# Patient Record
Sex: Female | Born: 1970 | Race: White | Hispanic: No | Smoking: Current every day smoker
Health system: Southern US, Community
[De-identification: ages and names within clinical notes are randomized; demographics above are authoritative.]

## PROBLEM LIST (undated history)

## (undated) DIAGNOSIS — D649 Anemia, unspecified: Secondary | ICD-10-CM

## (undated) DIAGNOSIS — K635 Polyp of colon: Secondary | ICD-10-CM

## (undated) DIAGNOSIS — N858 Other specified noninflammatory disorders of uterus: Secondary | ICD-10-CM

## (undated) DIAGNOSIS — R51 Headache: Secondary | ICD-10-CM

## (undated) DIAGNOSIS — K8020 Calculus of gallbladder without cholecystitis without obstruction: Secondary | ICD-10-CM

## (undated) DIAGNOSIS — R569 Unspecified convulsions: Secondary | ICD-10-CM

## (undated) DIAGNOSIS — K589 Irritable bowel syndrome without diarrhea: Secondary | ICD-10-CM

## (undated) DIAGNOSIS — I82409 Acute embolism and thrombosis of unspecified deep veins of unspecified lower extremity: Secondary | ICD-10-CM

## (undated) DIAGNOSIS — I10 Essential (primary) hypertension: Secondary | ICD-10-CM

## (undated) DIAGNOSIS — J189 Pneumonia, unspecified organism: Secondary | ICD-10-CM

## (undated) DIAGNOSIS — K219 Gastro-esophageal reflux disease without esophagitis: Secondary | ICD-10-CM

## (undated) DIAGNOSIS — G8929 Other chronic pain: Secondary | ICD-10-CM

## (undated) HISTORY — DX: Irritable bowel syndrome, unspecified: K58.9

## (undated) HISTORY — DX: Acute embolism and thrombosis of unspecified deep veins of unspecified lower extremity: I82.409

## (undated) HISTORY — DX: Headache: R51

## (undated) HISTORY — DX: Polyp of colon: K63.5

## (undated) HISTORY — DX: Calculus of gallbladder without cholecystitis without obstruction: K80.20

## (undated) HISTORY — DX: Essential (primary) hypertension: I10

## (undated) HISTORY — DX: Unspecified convulsions: R56.9

## (undated) HISTORY — DX: Gastro-esophageal reflux disease without esophagitis: K21.9

## (undated) HISTORY — DX: Other chronic pain: G89.29

## (undated) HISTORY — DX: Pneumonia, unspecified organism: J18.9

## (undated) HISTORY — PX: DILATION AND CURETTAGE OF UTERUS: SHX78

## (undated) HISTORY — DX: Anemia, unspecified: D64.9

## (undated) HISTORY — DX: Other specified noninflammatory disorders of uterus: N85.8

---

## 2001-11-19 ENCOUNTER — Encounter: Payer: Self-pay | Admitting: Family Medicine

## 2001-11-19 ENCOUNTER — Ambulatory Visit (HOSPITAL_COMMUNITY): Admission: RE | Admit: 2001-11-19 | Discharge: 2001-11-19 | Payer: Self-pay | Admitting: Family Medicine

## 2002-01-14 ENCOUNTER — Other Ambulatory Visit: Admission: RE | Admit: 2002-01-14 | Discharge: 2002-01-14 | Payer: Self-pay | Admitting: Family Medicine

## 2002-01-22 ENCOUNTER — Ambulatory Visit (HOSPITAL_COMMUNITY): Admission: RE | Admit: 2002-01-22 | Discharge: 2002-01-22 | Payer: Self-pay | Admitting: Family Medicine

## 2002-01-22 ENCOUNTER — Encounter: Payer: Self-pay | Admitting: Family Medicine

## 2002-05-05 ENCOUNTER — Encounter: Payer: Self-pay | Admitting: Family Medicine

## 2002-05-05 ENCOUNTER — Ambulatory Visit (HOSPITAL_COMMUNITY): Admission: RE | Admit: 2002-05-05 | Discharge: 2002-05-05 | Payer: Self-pay | Admitting: Family Medicine

## 2002-05-08 ENCOUNTER — Inpatient Hospital Stay (HOSPITAL_COMMUNITY): Admission: AD | Admit: 2002-05-08 | Discharge: 2002-05-08 | Payer: Self-pay | Admitting: Family Medicine

## 2002-05-14 ENCOUNTER — Inpatient Hospital Stay (HOSPITAL_COMMUNITY): Admission: AD | Admit: 2002-05-14 | Discharge: 2002-05-14 | Payer: Self-pay | Admitting: Family Medicine

## 2002-06-01 ENCOUNTER — Inpatient Hospital Stay (HOSPITAL_COMMUNITY): Admission: AD | Admit: 2002-06-01 | Discharge: 2002-06-01 | Payer: Self-pay | Admitting: Family Medicine

## 2002-06-05 ENCOUNTER — Inpatient Hospital Stay (HOSPITAL_COMMUNITY): Admission: AD | Admit: 2002-06-05 | Discharge: 2002-06-10 | Payer: Self-pay | Admitting: Family Medicine

## 2004-03-21 ENCOUNTER — Ambulatory Visit: Payer: Self-pay | Admitting: Family Medicine

## 2004-08-10 ENCOUNTER — Ambulatory Visit: Payer: Self-pay | Admitting: Family Medicine

## 2004-10-13 ENCOUNTER — Ambulatory Visit: Payer: Self-pay | Admitting: Family Medicine

## 2004-12-07 ENCOUNTER — Ambulatory Visit: Payer: Self-pay | Admitting: Family Medicine

## 2005-04-02 ENCOUNTER — Ambulatory Visit: Payer: Self-pay | Admitting: Family Medicine

## 2005-04-27 ENCOUNTER — Ambulatory Visit: Payer: Self-pay | Admitting: Family Medicine

## 2005-06-15 ENCOUNTER — Ambulatory Visit: Payer: Self-pay | Admitting: Family Medicine

## 2005-06-19 ENCOUNTER — Ambulatory Visit: Payer: Self-pay | Admitting: Family Medicine

## 2005-07-26 ENCOUNTER — Ambulatory Visit: Payer: Self-pay | Admitting: Family Medicine

## 2006-06-11 ENCOUNTER — Ambulatory Visit: Payer: Self-pay | Admitting: Family Medicine

## 2006-07-31 ENCOUNTER — Ambulatory Visit: Payer: Self-pay | Admitting: Family Medicine

## 2006-12-05 ENCOUNTER — Encounter: Admission: RE | Admit: 2006-12-05 | Discharge: 2006-12-05 | Payer: Self-pay | Admitting: Gastroenterology

## 2006-12-06 ENCOUNTER — Encounter: Admission: RE | Admit: 2006-12-06 | Discharge: 2006-12-06 | Payer: Self-pay | Admitting: Gastroenterology

## 2006-12-19 ENCOUNTER — Encounter: Admission: RE | Admit: 2006-12-19 | Discharge: 2006-12-19 | Payer: Self-pay | Admitting: Gastroenterology

## 2007-05-28 ENCOUNTER — Emergency Department (HOSPITAL_COMMUNITY): Admission: EM | Admit: 2007-05-28 | Discharge: 2007-05-29 | Payer: Self-pay | Admitting: Emergency Medicine

## 2007-06-11 ENCOUNTER — Ambulatory Visit: Payer: Self-pay | Admitting: Gastroenterology

## 2010-09-19 NOTE — Assessment & Plan Note (Signed)
[facility]                         GASTROENTEROLOGY OFFICE NOTE   KENEDIE, DIROCCO                      MRN:          [id number]  DATE:06/11/2007                            DOB:          Jun 09, 1970    REFERRING PHYSICIAN:  SCOTT LONG, PA-C   REASON FOR CONSULTATION:  Chronic abdominal pain.   HISTORY OF PRESENT ILLNESS:  Mrs. Degeorge is a 40 year old white female,  who relates a long history of generalized abdominal pain, described as  stabbing and crampy, associated with abdominal bloating and alternating  diarrhea and constipation.  She was evaluated by Dr. Charlott Rakes in  the fall of 2008 and underwent an upper endoscopy on January 29, 2007,  that showed a small hiatal hernia. A duodenal biopsy showed normal  duodenal mucosa.  She underwent colonoscopy on January 29, 2007, which  showed a 3 mm polyp at the rectosigmoid junction with biopsies showing  adenomatous tissue.  She has had worsening problems with tightness in  her throat, heartburn, indigestion, intermittent nausea and vomiting,  chest pain, generalized abdominal pain, bloating and a change in  appetite.  Apparently she could not get a rapid appointment with Dr.  Charlott Rakes for followup, so she scheduled an appointment with our  office.  She was seen in the emergency room on May 28, 2007.  A  urinalysis, CBC, chemistry panel and lipase were all performed, showing  a hemoglobin of 9.1 with an MCV of 71.3.  She has had longstanding  problems with menorrhagia and has had a recent ovarian cyst diagnosed.  She has followup with her gynecologist, Dr. Mora Appl, in [place], in two  weeks for these problems.   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  A chest CT  was  performed in August of 2008, which showed small subpleural nodules with  recommendations for followup CT scan.  Transabdominal and transvaginal  ultrasound on December 06, 2006, showed a 5.1 cm septated left ovarian cyst  and a uterine fibroid, measuring 2 cm.   PAST MEDICAL HISTORY:  Hypertension  adenomatous colon polyp  status post C-sections - 2004 and 2007  status post ovarian cystectomy - 2002   CURRENT MEDICATIONS:  1. Amoxicillin 500 mg t.i.d.  2. Multivitamin daily.  3. Prilosec OTC daily p.r.n.   MEDICATION ALLERGIES:  HYDROCODONE, leading to itching and hives.   SOCIAL HISTORY:  Per the handwritten form.   REVIEW OF SYSTEMS:  Per the handwritten form.   PHYSICAL EXAM:  Obese white female with a flat affect, who became  tearful during the interview when describing her problems.  Blood pressure 124/80, pulse 92 and regular.  HEENT EXAM:  Anicteric sclerae.  Oropharynx clear.  CHEST:  Clear to auscultation bilaterally.  CARDIAC:  Regular rate and rhythm without murmurs appreciated.  ABDOMEN:  Large, soft and nondistended with normoactive bowel sounds.  She has mild diffuse tenderness  to deep palpation without rebound or  guarding.  No palpable organomegaly, masses or hernias.  Normoactive  bowel sounds.  EXTREMITIES:  Without clubbing, cyanosis or edema.  NEUROLOGIC:  Alert and oriented times three, flat affect and tearful.  Otherwise, grossly nonfocal.   ASSESSMENT AND PLAN:  Generalized abdominal pain, associated with  abdominal bloating, variable bowel habits, a microcytic anemia-presumed  iron deficiency, GERD and globus sensation.  I am concerned she may have  underlying depression or anxiety.  She is advised to follow up with  Lindaann Pascal, P.A.-C., and she is advised to choose one  gastroenterologist, either me or Dr. Bosie Clos for long-term management.  Begin standard antireflux measures for GERD.  Begin Xifaxan 400 mg  t.i.d. for ten days, Robinul Forte  b.i.d., discontinue Prilosec and  begin Aciphex 20 mg p.o. q.a.m.  She is given information on a low-gas  diet with antireflux measures.  We will obtain standard blood work for  evaluation of her anemia and stool Hemoccults. Her anemia is likely  seconary to mentrual losses. She may need a small-bowel series or  capsule endoscopy to complete her evaluation.     Venita Lick. Russella Dar, MD, [facility]  Electronically Signed    MTS/MedQ  DD: 06/11/2007  DT: 06/11/2007  Job #: [id number]   cc:   Lindaann Pascal, PA

## 2011-01-26 LAB — DIFFERENTIAL
Eosinophils Absolute: 0.3
Eosinophils Relative: 3
Lymphs Abs: 2.7
Monocytes Relative: 6

## 2011-01-26 LAB — CBC
MCHC: 32
RBC: 4
WBC: 8.4

## 2011-01-26 LAB — URINALYSIS, ROUTINE W REFLEX MICROSCOPIC
Glucose, UA: NEGATIVE
Protein, ur: NEGATIVE
Specific Gravity, Urine: 1.021
pH: 7

## 2011-01-26 LAB — POCT PREGNANCY, URINE
Operator id: 29727
Preg Test, Ur: NEGATIVE

## 2011-01-26 LAB — COMPREHENSIVE METABOLIC PANEL
ALT: 19
AST: 18
Alkaline Phosphatase: 94
CO2: 22
Calcium: 8.9
GFR calc Af Amer: >60
GFR calc non Af Amer: >60
Potassium: 3.9
Sodium: 135

## 2011-01-26 LAB — LIPASE, BLOOD: Lipase: 26

## 2012-01-04 ENCOUNTER — Other Ambulatory Visit (HOSPITAL_COMMUNITY): Payer: Self-pay | Admitting: Adult Health Nurse Practitioner

## 2012-01-04 DIAGNOSIS — R109 Unspecified abdominal pain: Secondary | ICD-10-CM

## 2012-01-09 ENCOUNTER — Other Ambulatory Visit (HOSPITAL_COMMUNITY): Payer: Self-pay

## 2012-06-05 ENCOUNTER — Encounter (INDEPENDENT_AMBULATORY_CARE_PROVIDER_SITE_OTHER): Payer: Self-pay

## 2012-06-05 ENCOUNTER — Encounter (INDEPENDENT_AMBULATORY_CARE_PROVIDER_SITE_OTHER): Payer: Self-pay | Admitting: Surgery

## 2012-06-11 ENCOUNTER — Encounter (HOSPITAL_BASED_OUTPATIENT_CLINIC_OR_DEPARTMENT_OTHER): Payer: Self-pay | Admitting: *Deleted

## 2012-06-11 ENCOUNTER — Encounter (INDEPENDENT_AMBULATORY_CARE_PROVIDER_SITE_OTHER): Payer: Self-pay | Admitting: Surgery

## 2012-06-11 ENCOUNTER — Ambulatory Visit (INDEPENDENT_AMBULATORY_CARE_PROVIDER_SITE_OTHER): Payer: 59 | Admitting: Surgery

## 2012-06-11 VITALS — BP 138/90 | HR 86 | Temp 98.4°F | Ht 63.0 in | Wt 209.8 lb

## 2012-06-11 DIAGNOSIS — K8010 Calculus of gallbladder with chronic cholecystitis without obstruction: Secondary | ICD-10-CM | POA: Insufficient documentation

## 2012-06-11 NOTE — Progress Notes (Signed)
Patient ID: Alexis Miranda, female   DOB: 02/03/1971, 41 y.o.   MRN: [id number]  Chief Complaint  Patient presents with  . New Evaluation    eval gallbladder    HPI Alexis Miranda is a 41 y.o. female.  Referred by Dr. Leonard Nyland for evaluation of gallbladder disease HPI This is a 41-year-old female in good health who presents with 2 month history of intermittent right upper quadrant abdominal pain, nausea, vomiting, diarrhea. She states over the last several days this pain has become more constant. It gets worse when she tries to eat. She has also had a lot of abdominal bloating. She underwent a workup including an ultrasound showing inflammatory changes in the wall for gallbladder with several small echogenic foci in the gallbladder wall. There are no mobile gallstones noted. Her liver function tests from 2 weeks ago are normal. She is now referred for surgical evaluation.  Past Medical History  Diagnosis Date  . Anemia   . GERD (gastroesophageal reflux disease)   . Hypertension     Past Surgical History  Procedure Date  . Abdominal hysterectomy   . Cystectomy   . Cesarean section 06/07/02 03/07/06    2x    Family History  Problem Relation Age of Onset  . Heart attack Mother   . Cancer Father     colon    Social History History  Substance Use Topics  . Smoking status: Former Smoker  . Smokeless tobacco: Former User    Quit date: 06/11/2005  . Alcohol Use: Yes     Comment: prn    Allergies  Allergen Reactions  . Hydrocodone     Current Outpatient Prescriptions  Medication Sig Dispense Refill  . Vitamin D, Ergocalciferol, (DRISDOL) 50000 UNITS CAPS Take 50,000 Units by mouth every 7 (seven) days.        Review of Systems Review of Systems  Constitutional: Negative for fever, chills and unexpected weight change.  HENT: Negative for hearing loss, congestion, sore throat, trouble swallowing and voice change.   Eyes: Negative for visual disturbance.   Respiratory: Negative for cough and wheezing.   Cardiovascular: Negative for chest pain, palpitations and leg swelling.  Gastrointestinal: Positive for nausea, vomiting, abdominal pain, diarrhea and abdominal distention. Negative for constipation, blood in stool and anal bleeding.  Genitourinary: Negative for hematuria, vaginal bleeding and difficulty urinating.  Musculoskeletal: Negative for arthralgias.  Skin: Negative for rash and wound.  Neurological: Negative for seizures, syncope and headaches.  Hematological: Negative for adenopathy. Does not bruise/bleed easily.  Psychiatric/Behavioral: Negative for confusion.    Blood pressure 138/90, pulse 86, temperature 98.4 F (36.9 C), temperature source Temporal, height 5' 3" (1.6 m), weight 209 lb 12.8 oz (95.165 kg), SpO2 98.00%.  Physical Exam Physical Exam WDWN in NAD HEENT:  EOMI, sclera anicteric Neck:  No masses, no thyromegaly Lungs:  CTA bilaterally; normal respiratory effort CV:  Regular rate and rhythm; no murmurs Abd:  +bowel sounds,tender in RUQ with radiation around to her back Ext:  Well-perfused; no edema Skin:  Warm, dry; no sign of jaundice  Data Reviewed Ultrasound/ labs as described above  Assessment    Chronic, possibly acute cholecystitis    Plan    Laparoscopic cholecystectomy with intraoperative cholangiogram.  The surgical procedure has been discussed with the patient.  Potential risks, benefits, alternative treatments, and expected outcomes have been explained.  All of the patient's questions at this time have been answered.  The likelihood of reaching the patient's treatment   goal is good.  The patient understand the proposed surgical procedure and wishes to proceed.        Wise Fees K. 06/11/2012, 2:39 PM    

## 2012-06-11 NOTE — Progress Notes (Signed)
No labs needed

## 2012-06-12 ENCOUNTER — Ambulatory Visit (HOSPITAL_BASED_OUTPATIENT_CLINIC_OR_DEPARTMENT_OTHER): Payer: 59 | Admitting: Certified Registered Nurse Anesthetist

## 2012-06-12 ENCOUNTER — Encounter (HOSPITAL_BASED_OUTPATIENT_CLINIC_OR_DEPARTMENT_OTHER)
Admission: RE | Disposition: A | Discharge status: Home / Self Care | Payer: Self-pay | Source: Ambulatory Visit | Attending: Surgery

## 2012-06-12 ENCOUNTER — Encounter (HOSPITAL_BASED_OUTPATIENT_CLINIC_OR_DEPARTMENT_OTHER): Payer: Self-pay | Admitting: *Deleted

## 2012-06-12 ENCOUNTER — Encounter (HOSPITAL_BASED_OUTPATIENT_CLINIC_OR_DEPARTMENT_OTHER): Payer: Self-pay | Admitting: Certified Registered Nurse Anesthetist

## 2012-06-12 ENCOUNTER — Ambulatory Visit (HOSPITAL_BASED_OUTPATIENT_CLINIC_OR_DEPARTMENT_OTHER)
Admission: RE | Admit: 2012-06-12 | Discharge: 2012-06-12 | Disposition: A | Discharge status: Home / Self Care | Payer: 59 | Source: Ambulatory Visit | Attending: Surgery | Admitting: Surgery

## 2012-06-12 ENCOUNTER — Ambulatory Visit (HOSPITAL_COMMUNITY): Payer: 59

## 2012-06-12 DIAGNOSIS — K8010 Calculus of gallbladder with chronic cholecystitis without obstruction: Secondary | ICD-10-CM | POA: Insufficient documentation

## 2012-06-12 DIAGNOSIS — I10 Essential (primary) hypertension: Secondary | ICD-10-CM | POA: Insufficient documentation

## 2012-06-12 DIAGNOSIS — K811 Chronic cholecystitis: Secondary | ICD-10-CM

## 2012-06-12 HISTORY — PX: CHOLECYSTECTOMY: SHX55

## 2012-06-12 LAB — POCT HEMOGLOBIN-HEMACUE: Hemoglobin: 12.6 g/dL (ref 12.0–15.0)

## 2012-06-12 SURGERY — LAPAROSCOPIC CHOLECYSTECTOMY WITH INTRAOPERATIVE CHOLANGIOGRAM
Anesthesia: General | Site: Abdomen | Wound class: Clean

## 2012-06-12 MED ORDER — HYDROMORPHONE HCL 2 MG PO TABS: 2.0000 mg | ORAL_TABLET | ORAL | Status: DC | PRN

## 2012-06-12 MED ORDER — BUPIVACAINE-EPINEPHRINE 0.25% -1:200000 IJ SOLN
INTRAMUSCULAR | Status: DC | PRN
  Administered 2012-06-12: 13 mL

## 2012-06-12 MED ORDER — SODIUM CHLORIDE 0.9 % IR SOLN
Status: DC | PRN
  Administered 2012-06-12: 1000 mL

## 2012-06-12 MED ORDER — PROPOFOL 10 MG/ML IV BOLUS
INTRAVENOUS | Status: DC | PRN
  Administered 2012-06-12: 200 mg via INTRAVENOUS

## 2012-06-12 MED ORDER — MIDAZOLAM HCL 2 MG/2ML IJ SOLN: 1.0000 mg | INTRAMUSCULAR | Status: DC | PRN

## 2012-06-12 MED ORDER — MIDAZOLAM HCL 5 MG/5ML IJ SOLN
INTRAMUSCULAR | Status: DC | PRN
  Administered 2012-06-12: 1 mg via INTRAVENOUS

## 2012-06-12 MED ORDER — HYDROMORPHONE HCL PF 1 MG/ML IJ SOLN
0.2500 mg | INTRAMUSCULAR | Status: DC | PRN
  Administered 2012-06-12 (×4): 0.5 mg via INTRAVENOUS

## 2012-06-12 MED ORDER — CEFAZOLIN SODIUM-DEXTROSE 2-3 GM-% IV SOLR
2.0000 g | INTRAVENOUS | Status: AC
  Administered 2012-06-12: 2 g via INTRAVENOUS

## 2012-06-12 MED ORDER — LABETALOL HCL 5 MG/ML IV SOLN
5.0000 mg | INTRAVENOUS | Status: DC | PRN
  Administered 2012-06-12 (×2): 10 mg via INTRAVENOUS

## 2012-06-12 MED ORDER — KETOROLAC TROMETHAMINE 30 MG/ML IJ SOLN
INTRAMUSCULAR | Status: DC | PRN
  Administered 2012-06-12: 30 mg via INTRAVENOUS

## 2012-06-12 MED ORDER — ONDANSETRON HCL 4 MG/2ML IJ SOLN
INTRAMUSCULAR | Status: DC | PRN
  Administered 2012-06-12: 4 mg via INTRAVENOUS

## 2012-06-12 MED ORDER — NEOSTIGMINE METHYLSULFATE 1 MG/ML IJ SOLN
INTRAMUSCULAR | Status: DC | PRN
  Administered 2012-06-12: 3.5 mg via INTRAVENOUS

## 2012-06-12 MED ORDER — PROMETHAZINE HCL 25 MG/ML IJ SOLN: 6.2500 mg | INTRAMUSCULAR | Status: DC | PRN

## 2012-06-12 MED ORDER — MORPHINE SULFATE 2 MG/ML IJ SOLN: 2.0000 mg | INTRAMUSCULAR | Status: DC | PRN

## 2012-06-12 MED ORDER — IOHEXOL 300 MG/ML  SOLN
INTRAMUSCULAR | Status: DC | PRN
  Administered 2012-06-12: 13:00:00

## 2012-06-12 MED ORDER — CHLORHEXIDINE GLUCONATE 4 % EX LIQD: 1.0000 "application " | Freq: Once | CUTANEOUS | Status: DC

## 2012-06-12 MED ORDER — FENTANYL CITRATE 0.05 MG/ML IJ SOLN: 50.0000 ug | INTRAMUSCULAR | Status: DC | PRN

## 2012-06-12 MED ORDER — ONDANSETRON HCL 4 MG/2ML IJ SOLN: 4.0000 mg | INTRAMUSCULAR | Status: DC | PRN

## 2012-06-12 MED ORDER — ROCURONIUM BROMIDE 100 MG/10ML IV SOLN
INTRAVENOUS | Status: DC | PRN
  Administered 2012-06-12: 40 mg via INTRAVENOUS

## 2012-06-12 MED ORDER — LACTATED RINGERS IV SOLN
INTRAVENOUS | Status: DC
  Administered 2012-06-12 (×3): via INTRAVENOUS

## 2012-06-12 MED ORDER — DEXAMETHASONE SODIUM PHOSPHATE 4 MG/ML IJ SOLN
INTRAMUSCULAR | Status: DC | PRN
  Administered 2012-06-12: 10 mg via INTRAVENOUS

## 2012-06-12 MED ORDER — PROMETHAZINE HCL 25 MG/ML IJ SOLN
6.2500 mg | INTRAMUSCULAR | Status: AC | PRN
  Administered 2012-06-12 (×2): 6.25 mg via INTRAVENOUS

## 2012-06-12 MED ORDER — FENTANYL CITRATE 0.05 MG/ML IJ SOLN: 50.0000 ug | Freq: Once | INTRAMUSCULAR | Status: DC

## 2012-06-12 MED ORDER — LIDOCAINE HCL (CARDIAC) 20 MG/ML IV SOLN
INTRAVENOUS | Status: DC | PRN
  Administered 2012-06-12: 60 mg via INTRAVENOUS

## 2012-06-12 MED ORDER — HYDROMORPHONE HCL 2 MG PO TABS
2.0000 mg | ORAL_TABLET | Freq: Once | ORAL | Status: AC | PRN
  Administered 2012-06-12: 2 mg via ORAL

## 2012-06-12 MED ORDER — GLYCOPYRROLATE 0.2 MG/ML IJ SOLN
INTRAMUSCULAR | Status: DC | PRN
  Administered 2012-06-12: .5 mg via INTRAVENOUS

## 2012-06-12 MED ORDER — FENTANYL CITRATE 0.05 MG/ML IJ SOLN
INTRAMUSCULAR | Status: DC | PRN
  Administered 2012-06-12 (×2): 25 ug via INTRAVENOUS
  Administered 2012-06-12: 50 ug via INTRAVENOUS

## 2012-06-12 MED ORDER — PROMETHAZINE HCL 12.5 MG PO TABS: 12.5000 mg | ORAL_TABLET | Freq: Four times a day (QID) | ORAL | Status: DC | PRN

## 2012-06-12 SURGICAL SUPPLY — 46 items
APPLIER CLIP ROT 10 11.4 M/L (STAPLE) ×2
BENZOIN TINCTURE PRP APPL 2/3 (GAUZE/BANDAGES/DRESSINGS) ×2 IMPLANT
BLADE SURG ROTATE 9660 (MISCELLANEOUS) IMPLANT
CANISTER SUCTION 2500CC (MISCELLANEOUS) ×2 IMPLANT
CHLORAPREP W/TINT 26ML (MISCELLANEOUS) ×2 IMPLANT
CLIP APPLIE ROT 10 11.4 M/L (STAPLE) ×1 IMPLANT
CLOTH BEACON ORANGE TIMEOUT ST (SAFETY) ×2 IMPLANT
COVER MAYO STAND STRL (DRAPES) ×2 IMPLANT
COVER SURGICAL LIGHT HANDLE (MISCELLANEOUS) ×2 IMPLANT
DECANTER SPIKE VIAL GLASS SM (MISCELLANEOUS) ×4 IMPLANT
DRAPE C-ARM 42X72 X-RAY (DRAPES) ×2 IMPLANT
DRAPE UTILITY 15X26 W/TAPE STR (DRAPE) ×4 IMPLANT
DRSG TEGADERM 2-3/8X2-3/4 SM (GAUZE/BANDAGES/DRESSINGS) ×6 IMPLANT
DRSG TEGADERM 4X4.75 (GAUZE/BANDAGES/DRESSINGS) ×2 IMPLANT
ELECT REM PT RETURN 9FT ADLT (ELECTROSURGICAL) ×2
ELECTRODE REM PT RTRN 9FT ADLT (ELECTROSURGICAL) ×1 IMPLANT
FILTER SMOKE EVAC LAPAROSHD (FILTER) ×4 IMPLANT
GLOVE BIO SURGEON STRL SZ 6.5 (GLOVE) ×2 IMPLANT
GLOVE BIO SURGEON STRL SZ7 (GLOVE) ×2 IMPLANT
GLOVE BIOGEL PI IND STRL 6.5 (GLOVE) ×1 IMPLANT
GLOVE BIOGEL PI IND STRL 7.5 (GLOVE) ×1 IMPLANT
GLOVE BIOGEL PI INDICATOR 6.5 (GLOVE) ×1
GLOVE BIOGEL PI INDICATOR 7.5 (GLOVE) ×1
GLOVE ECLIPSE 6.5 STRL STRAW (GLOVE) ×2 IMPLANT
GLOVE INDICATOR 7.0 STRL GRN (GLOVE) ×2 IMPLANT
GLOVE SKINSENSE NS SZ7.0 (GLOVE) ×1
GLOVE SKINSENSE STRL SZ7.0 (GLOVE) ×1 IMPLANT
GOWN STRL NON-REIN LRG LVL3 (GOWN DISPOSABLE) ×8 IMPLANT
HEMOSTAT SNOW SURGICEL 2X4 (HEMOSTASIS) ×2 IMPLANT
NS IRRIG 1000ML POUR BTL (IV SOLUTION) IMPLANT
PACK BASIN DAY SURGERY FS (CUSTOM PROCEDURE TRAY) ×2 IMPLANT
PAD ARMBOARD 7.5X6 YLW CONV (MISCELLANEOUS) ×2 IMPLANT
POUCH SPECIMEN RETRIEVAL 10MM (ENDOMECHANICALS) ×2 IMPLANT
SCISSORS LAP 5X35 DISP (ENDOMECHANICALS) IMPLANT
SET CHOLANGIOGRAPH 5 50 .035 (SET/KITS/TRAYS/PACK) ×2 IMPLANT
SET IRRIG TUBING LAPAROSCOPIC (IRRIGATION / IRRIGATOR) ×2 IMPLANT
SLEEVE ENDOPATH XCEL 5M (ENDOMECHANICALS) ×2 IMPLANT
SPECIMEN JAR SMALL (MISCELLANEOUS) ×2 IMPLANT
SPONGE GAUZE 2X2 8PLY STRL LF (GAUZE/BANDAGES/DRESSINGS) ×2 IMPLANT
SUT MNCRL AB 4-0 PS2 18 (SUTURE) ×2 IMPLANT
SUT VICRYL 0 UR6 27IN ABS (SUTURE) IMPLANT
TOWEL OR 17X24 6PK STRL BLUE (TOWEL DISPOSABLE) ×2 IMPLANT
TRAY LAPAROSCOPIC (CUSTOM PROCEDURE TRAY) ×2 IMPLANT
TROCAR XCEL BLUNT TIP 100MML (ENDOMECHANICALS) ×2 IMPLANT
TROCAR XCEL NON-BLD 11X100MML (ENDOMECHANICALS) ×2 IMPLANT
TROCAR XCEL NON-BLD 5MMX100MML (ENDOMECHANICALS) ×2 IMPLANT

## 2012-06-12 NOTE — H&P (View-Only) (Signed)
Patient ID: Alexis Miranda, female   DOB: 1970-10-02, 42 y.o.   MRN: [id number]  Chief Complaint  Patient presents with  . New Evaluation    eval gallbladder    HPI Alexis Miranda is a 42 y.o. female.  Referred by Dr. Joette Catching for evaluation of gallbladder disease HPI This is a 42 year old female in good health who presents with 2 month history of intermittent right upper quadrant abdominal pain, nausea, vomiting, diarrhea. She states over the last several days this pain has become more constant. It gets worse when she tries to eat. She has also had a lot of abdominal bloating. She underwent a workup including an ultrasound showing inflammatory changes in the wall for gallbladder with several small echogenic foci in the gallbladder wall. There are no mobile gallstones noted. Her liver function tests from 2 weeks ago are normal. She is now referred for surgical evaluation.  Past Medical History  Diagnosis Date  . Anemia   . GERD (gastroesophageal reflux disease)   . Hypertension     Past Surgical History  Procedure Date  . Abdominal hysterectomy   . Cystectomy   . Cesarean section 06/07/02 03/07/06    2x    Family History  Problem Relation Age of Onset  . Heart attack Mother   . Cancer Father     colon    Social History History  Substance Use Topics  . Smoking status: Former Games developer  . Smokeless tobacco: Former Neurosurgeon    Quit date: 06/11/2005  . Alcohol Use: Yes     Comment: prn    Allergies  Allergen Reactions  . Hydrocodone     Current Outpatient Prescriptions  Medication Sig Dispense Refill  . Vitamin D, Ergocalciferol, (DRISDOL) 50000 UNITS CAPS Take 50,000 Units by mouth every 7 (seven) days.        Review of Systems Review of Systems  Constitutional: Negative for fever, chills and unexpected weight change.  HENT: Negative for hearing loss, congestion, sore throat, trouble swallowing and voice change.   Eyes: Negative for visual disturbance.   Respiratory: Negative for cough and wheezing.   Cardiovascular: Negative for chest pain, palpitations and leg swelling.  Gastrointestinal: Positive for nausea, vomiting, abdominal pain, diarrhea and abdominal distention. Negative for constipation, blood in stool and anal bleeding.  Genitourinary: Negative for hematuria, vaginal bleeding and difficulty urinating.  Musculoskeletal: Negative for arthralgias.  Skin: Negative for rash and wound.  Neurological: Negative for seizures, syncope and headaches.  Hematological: Negative for adenopathy. Does not bruise/bleed easily.  Psychiatric/Behavioral: Negative for confusion.    Blood pressure 138/90, pulse 86, temperature 98.4 F (36.9 C), temperature source Temporal, height 5\' 3"  (1.6 m), weight 209 lb 12.8 oz (95.165 kg), SpO2 98.00%.  Physical Exam Physical Exam WDWN in NAD HEENT:  EOMI, sclera anicteric Neck:  No masses, no thyromegaly Lungs:  CTA bilaterally; normal respiratory effort CV:  Regular rate and rhythm; no murmurs Abd:  +bowel sounds,tender in RUQ with radiation around to her back Ext:  Well-perfused; no edema Skin:  Warm, dry; no sign of jaundice  Data Reviewed Ultrasound/ labs as described above  Assessment    Chronic, possibly acute cholecystitis    Plan    Laparoscopic cholecystectomy with intraoperative cholangiogram.  The surgical procedure has been discussed with the patient.  Potential risks, benefits, alternative treatments, and expected outcomes have been explained.  All of the patient's questions at this time have been answered.  The likelihood of reaching the patient's treatment  goal is good.  The patient understand the proposed surgical procedure and wishes to proceed.        Sharde Gover K. 06/11/2012, 2:39 PM

## 2012-06-12 NOTE — Interval H&P Note (Signed)
History and Physical Interval Note:  06/12/2012 11:34 AM  Alexis Miranda  has presented today for surgery, with the diagnosis of chronic calculus cholecystitis  The various methods of treatment have been discussed with the patient and family. After consideration of risks, benefits and other options for treatment, the patient has consented to  Procedure(s) (LRB) with comments: LAPAROSCOPIC CHOLECYSTECTOMY WITH INTRAOPERATIVE CHOLANGIOGRAM (N/A) as a surgical intervention .  The patient's history has been reviewed, patient examined, no change in status, stable for surgery.  I have reviewed the patient's chart and labs.  Questions were answered to the patient's satisfaction.     [name]

## 2012-06-12 NOTE — Anesthesia Preprocedure Evaluation (Signed)
Anesthesia Evaluation  Patient identified by MRN, date of birth, ID band Patient awake    Reviewed: Allergy & Precautions, H&P , NPO status , Patient's Chart, lab work & pertinent test results  Airway Mallampati: II TM Distance: >3 FB Neck ROM: Full    Dental   Pulmonary former smoker,  breath sounds clear to auscultation        Cardiovascular hypertension, Rhythm:Regular Rate:Normal     Neuro/Psych    GI/Hepatic GERD-  ,  Endo/Other    Renal/GU      Musculoskeletal   Abdominal (+) + obese,   Peds  Hematology   Anesthesia Other Findings   Reproductive/Obstetrics                           Anesthesia Physical Anesthesia Plan  ASA: II  Anesthesia Plan: General   Post-op Pain Management:    Induction: Intravenous  Airway Management Planned: Oral ETT  Additional Equipment:   Intra-op Plan:   Post-operative Plan: Extubation in OR  Informed Consent: I have reviewed the patients History and Physical, chart, labs and discussed the procedure including the risks, benefits and alternatives for the proposed anesthesia with the patient or authorized representative who has indicated his/her understanding and acceptance.     Plan Discussed with: CRNA and Surgeon  Anesthesia Plan Comments:         Anesthesia Quick Evaluation

## 2012-06-12 NOTE — Anesthesia Postprocedure Evaluation (Signed)
  Anesthesia Post-op Note  Patient: Alexis Miranda  Procedure(s) Performed: Procedure(s) (LRB) with comments: LAPAROSCOPIC CHOLECYSTECTOMY WITH INTRAOPERATIVE CHOLANGIOGRAM (N/A)  Patient Location: PACU  Anesthesia Type:General  Level of Consciousness: awake  Airway and Oxygen Therapy: Patient Spontanous Breathing  Post-op Pain: mild  Post-op Assessment: Post-op Vital signs reviewed, Patient's Cardiovascular Status Stable, Respiratory Function Stable, Patent Airway, No signs of Nausea or vomiting, Adequate PO intake and Pain level controlled  Post-op Vital Signs: stable  Complications: No apparent anesthesia complications

## 2012-06-12 NOTE — Transfer of Care (Signed)
Immediate Anesthesia Transfer of Care Note  Patient: Alexis Miranda  Procedure(s) Performed: Procedure(s) (LRB) with comments: LAPAROSCOPIC CHOLECYSTECTOMY WITH INTRAOPERATIVE CHOLANGIOGRAM (N/A)  Patient Location: PACU  Anesthesia Type:General  Level of Consciousness: awake, alert , oriented and patient cooperative  Airway & Oxygen Therapy: Patient Spontanous Breathing and Patient connected to face mask oxygen  Post-op Assessment: Report given to PACU RN and Post -op Vital signs reviewed and stable  Post vital signs: Reviewed and stable  Complications: No apparent anesthesia complications

## 2012-06-12 NOTE — Op Note (Signed)
Laparoscopic Cholecystectomy with IOC Procedure Note  Indications: This patient presents with symptomatic gallbladder disease and will undergo laparoscopic cholecystectomy.  Pre-operative Diagnosis: Chronic cholecystitis  Post-operative Diagnosis: Same  Surgeon: Marissa Weaver K.   Assistants: none  Anesthesia: General endotracheal anesthesia  ASA Class: 2  Procedure Details  The patient was seen again in the Holding Room. The risks, benefits, complications, treatment options, and expected outcomes were discussed with the patient. The possibilities of reaction to medication, pulmonary aspiration, perforation of viscus, bleeding, recurrent infection, finding a normal gallbladder, the need for additional procedures, failure to diagnose a condition, the possible need to convert to an open procedure, and creating a complication requiring transfusion or operation were discussed with the patient. The likelihood of improving the patient's symptoms with return to their baseline status is good.  The patient and/or family concurred with the proposed plan, giving informed consent. The site of surgery properly noted. The patient was taken to Operating Room, identified as Alexis Miranda and the procedure verified as Laparoscopic Cholecystectomy with Intraoperative Cholangiogram. A Time Out was held and the above information confirmed.  Prior to the induction of general anesthesia, antibiotic prophylaxis was administered. General endotracheal anesthesia was then administered and tolerated well. After the induction, the abdomen was prepped with Chloraprep and draped in the sterile fashion. The patient was positioned in the supine position.  Local anesthetic agent was injected into the skin near the umbilicus and an incision made. We dissected down to the abdominal fascia with blunt dissection.  The fascia was incised vertically and we entered the peritoneal cavity bluntly.  A pursestring suture of 0-Vicryl was  placed around the fascial opening.  The Hasson cannula was inserted and secured with the stay suture.  Pneumoperitoneum was then created with CO2 and tolerated well without any adverse changes in the patient's vital signs. An 11-mm port was placed in the subxiphoid position.  Two 5-mm ports were placed in the right upper quadrant. All skin incisions were infiltrated with a local anesthetic agent before making the incision and placing the trocars.   We positioned the patient in reverse Trendelenburg, tilted slightly to the patient's left.  The gallbladder was identified, the fundus grasped and retracted cephalad. The omentum was adherent to the fundus of the gallbladder.  Adhesions were lysed bluntly and with the electrocautery where indicated, taking care not to injure any adjacent organs or viscus. The infundibulum was grasped and retracted laterally, exposing the peritoneum overlying the triangle of Calot. This was then divided and exposed in a blunt fashion. A critical view of the cystic duct and cystic artery was obtained.  The cystic duct was clearly identified and bluntly dissected circumferentially. The cystic duct was ligated with a clip distally.   An incision was made in the cystic duct and the [facility] cholangiogram catheter introduced. The catheter was secured using a clip. A cholangiogram was then obtained which showed good visualization of the distal and proximal biliary tree with no sign of filling defects or obstruction.  Contrast flowed easily into the duodenum. The catheter was then removed.   The cystic duct was then ligated with clips and divided. The cystic artery was identified, dissected free, ligated with clips and divided as well.   The gallbladder was dissected from the liver bed in retrograde fashion with the electrocautery. The gallbladder was removed and placed in an Endocatch sac. The liver bed was irrigated and inspected. Hemostasis was achieved with the electrocautery. Copious  irrigation was utilized and was repeatedly  aspirated until clear.  The gallbladder and Endocatch sac were then removed through the umbilical port site.  The pursestring suture was used to close the umbilical fascia.    We again inspected the right upper quadrant for hemostasis.  Pneumoperitoneum was released as we removed the trocars.  4-0 Monocryl was used to close the skin.   Benzoin, steri-strips, and clean dressings were applied. The patient was then extubated and brought to the recovery room in stable condition. Instrument, sponge, and needle counts were correct at closure and at the conclusion of the case.   Findings: Cholecystitis with Cholelithiasis  Estimated Blood Loss: Minimal         Drains: noen         Specimens: Gallbladder           Complications: None; patient tolerated the procedure well.         Disposition: PACU - hemodynamically stable.         Condition: stable  Wilmon Arms. Corliss Skains, MD, [facility]  06/12/2012 1:15 PM

## 2012-06-12 NOTE — Anesthesia Procedure Notes (Signed)
Procedure Name: Intubation Date/Time: 06/12/2012 12:04 PM Performed by: Unknown Schleyer D Pre-anesthesia Checklist: Patient identified, Emergency Drugs available, Suction available and Patient being monitored Patient Re-evaluated:Patient Re-evaluated prior to inductionOxygen Delivery Method: Circle System Utilized Preoxygenation: Pre-oxygenation with 100% oxygen Intubation Type: IV induction Ventilation: Mask ventilation without difficulty Laryngoscope Size: Mac and 3 Grade View: Grade I Tube type: Oral Tube size: 7.0 mm Number of attempts: 1 Airway Equipment and Method: stylet and LTA kit utilized Placement Confirmation: ETT inserted through vocal cords under direct vision,  positive ETCO2 and breath sounds checked- equal and bilateral Secured at: 21 cm Tube secured with: Tape Dental Injury: Teeth and Oropharynx as per pre-operative assessment

## 2012-06-13 ENCOUNTER — Encounter (HOSPITAL_BASED_OUTPATIENT_CLINIC_OR_DEPARTMENT_OTHER): Payer: Self-pay | Admitting: Surgery

## 2012-07-04 ENCOUNTER — Encounter (INDEPENDENT_AMBULATORY_CARE_PROVIDER_SITE_OTHER): Payer: 59 | Admitting: Surgery

## 2012-07-08 ENCOUNTER — Ambulatory Visit (INDEPENDENT_AMBULATORY_CARE_PROVIDER_SITE_OTHER): Payer: 59 | Admitting: Surgery

## 2012-07-08 ENCOUNTER — Encounter (INDEPENDENT_AMBULATORY_CARE_PROVIDER_SITE_OTHER): Payer: Self-pay | Admitting: Surgery

## 2012-07-08 ENCOUNTER — Encounter (INDEPENDENT_AMBULATORY_CARE_PROVIDER_SITE_OTHER): Payer: 59 | Admitting: Surgery

## 2012-07-08 VITALS — BP 136/72 | HR 68 | Temp 97.2°F | Resp 16 | Ht 63.0 in | Wt 208.4 lb

## 2012-07-08 NOTE — Progress Notes (Signed)
Status post laparoscopic cholecystectomy with intraoperative cholangiogram on 06/12/12. Pathology showed chronic calculus cholecystitis. She is doing quite well. Her bowel movements are not yet regulated. She alternates between  Diarrhea after eating greasy foods to occasional constipation. No abdominal pain other than the expected incisional tenderness.  Her incisions are well-healed with no sign of infection. Minimal tenderness to palpation.  She may resume full activity. Followup as needed.  Wilmon Arms. Corliss Skains, MD, [facility]  07/08/2012 11:30 AM

## 2012-12-31 DIAGNOSIS — R079 Chest pain, unspecified: Secondary | ICD-10-CM

## 2015-11-01 ENCOUNTER — Encounter: Payer: Self-pay | Admitting: Internal Medicine

## 2015-11-17 ENCOUNTER — Telehealth: Payer: Self-pay | Admitting: Gastroenterology

## 2015-11-17 NOTE — Telephone Encounter (Signed)
Left message for patient to call back  

## 2015-12-01 ENCOUNTER — Other Ambulatory Visit (INDEPENDENT_AMBULATORY_CARE_PROVIDER_SITE_OTHER): Payer: BLUE CROSS/BLUE SHIELD

## 2015-12-01 ENCOUNTER — Encounter (INDEPENDENT_AMBULATORY_CARE_PROVIDER_SITE_OTHER): Payer: Self-pay

## 2015-12-01 ENCOUNTER — Encounter: Payer: Self-pay | Admitting: Physician Assistant

## 2015-12-01 ENCOUNTER — Ambulatory Visit (INDEPENDENT_AMBULATORY_CARE_PROVIDER_SITE_OTHER): Payer: BLUE CROSS/BLUE SHIELD | Admitting: Physician Assistant

## 2015-12-01 VITALS — BP 124/90 | HR 96 | Ht 61.25 in | Wt 209.2 lb

## 2015-12-01 DIAGNOSIS — K3189 Other diseases of stomach and duodenum: Secondary | ICD-10-CM

## 2015-12-01 DIAGNOSIS — R1084 Generalized abdominal pain: Secondary | ICD-10-CM

## 2015-12-01 DIAGNOSIS — R1310 Dysphagia, unspecified: Secondary | ICD-10-CM

## 2015-12-01 DIAGNOSIS — R194 Change in bowel habit: Secondary | ICD-10-CM

## 2015-12-01 DIAGNOSIS — R1013 Epigastric pain: Secondary | ICD-10-CM

## 2015-12-01 DIAGNOSIS — Z860100 Personal history of colon polyps, unspecified: Secondary | ICD-10-CM

## 2015-12-01 DIAGNOSIS — K219 Gastro-esophageal reflux disease without esophagitis: Secondary | ICD-10-CM

## 2015-12-01 DIAGNOSIS — K644 Residual hemorrhoidal skin tags: Secondary | ICD-10-CM

## 2015-12-01 DIAGNOSIS — K625 Hemorrhage of anus and rectum: Secondary | ICD-10-CM

## 2015-12-01 DIAGNOSIS — K648 Other hemorrhoids: Secondary | ICD-10-CM

## 2015-12-01 DIAGNOSIS — R198 Other specified symptoms and signs involving the digestive system and abdomen: Secondary | ICD-10-CM

## 2015-12-01 LAB — CBC WITH DIFFERENTIAL/PLATELET
BASOS PCT: 0.7 % (ref 0.0–3.0)
Basophils Absolute: 0.1 10*3/uL (ref 0.0–0.1)
EOS ABS: 0.2 10*3/uL (ref 0.0–0.7)
EOS PCT: 2.6 % (ref 0.0–5.0)
HEMATOCRIT: 37.2 % (ref 36.0–46.0)
HEMOGLOBIN: 12.4 g/dL (ref 12.0–15.0)
LYMPHS PCT: 29.9 % (ref 12.0–46.0)
Lymphs Abs: 2.7 10*3/uL (ref 0.7–4.0)
MCHC: 33.3 g/dL (ref 30.0–36.0)
MCV: 81.6 fl (ref 78.0–100.0)
MONOS PCT: 7.2 % (ref 3.0–12.0)
Monocytes Absolute: 0.7 10*3/uL (ref 0.1–1.0)
Neutro Abs: 5.5 10*3/uL (ref 1.4–7.7)
Neutrophils Relative %: 59.6 % (ref 43.0–77.0)
Platelets: 329 10*3/uL (ref 150.0–400.0)
RBC: 4.57 Mil/uL (ref 3.87–5.11)
RDW: 14 % (ref 11.5–15.5)
WBC: 9.2 10*3/uL (ref 4.0–10.5)

## 2015-12-01 LAB — COMPREHENSIVE METABOLIC PANEL
ALT: 13 U/L (ref 0–35)
AST: 14 U/L (ref 0–37)
Albumin: 4.2 g/dL (ref 3.5–5.2)
Alkaline Phosphatase: 84 U/L (ref 39–117)
BUN: 9 mg/dL (ref 6–23)
CO2: 27 mEq/L (ref 19–32)
Calcium: 9.5 mg/dL (ref 8.4–10.5)
Chloride: 106 mEq/L (ref 96–112)
Creatinine, Ser: 0.72 mg/dL (ref 0.40–1.20)
GFR: 92.18 mL/min (ref >60.00)
Glucose, Bld: 93 mg/dL (ref 70–99)
Potassium: 4.4 mEq/L (ref 3.5–5.1)
Sodium: 139 mEq/L (ref 135–145)
Total Bilirubin: 0.4 mg/dL (ref 0.2–1.2)
Total Protein: 7.3 g/dL (ref 6.0–8.3)

## 2015-12-01 MED ORDER — NA SULFATE-K SULFATE-MG SULF 17.5-3.13-1.6 GM/177ML PO SOLN: 1.0000 | Freq: Once | ORAL | 0 refills | Status: AC

## 2015-12-01 MED ORDER — OMEPRAZOLE 40 MG PO CPDR: 40.0000 mg | DELAYED_RELEASE_CAPSULE | Freq: Every day | ORAL | 3 refills | Status: DC

## 2015-12-01 MED ORDER — DILTIAZEM GEL 2 %: Freq: Two times a day (BID) | CUTANEOUS | Status: AC

## 2015-12-01 NOTE — Progress Notes (Signed)
Chief Complaint: Abdominal Pain, bloating, dyspepsia, dysphagia , rectal bleeding and GERD  HPI:  Alexis Miranda is a  45 y/o Caucasian female who was referred to me by Dione Housekeeper, MD for various GI complaints including abdominal pain, bloating, heartburn, dyspepsia, dysphagia and rectal bleeding.  Independent review of patient's last EGD and colonoscopy performed on 01/29/2007 was completed. Colonoscopy was performed for rectal bleeding and showed a finding of nonthrombosed external hemorrhoids and one 3 mm polyp in the rectosigmoid colon. Pathology revealed an adenomatous polyp with no high-grade dysplasia or invasive malignancy in the rectosigmoid colon. Repeat colonoscopy is recommended in 5 years. EGD was performed for heartburn, abdominal pain and nausea with vomiting and showed normal esophagus and a hiatal hernia. Pathology revealed benign small bowel mucosa with no active inflammation or villous atrophy in the duodenum.  Today, the patient presents to clinic accompanied by her young son, she begins by telling me that she just "feels bad". When asked she cannot remember the last time that she felt "well". She tells me that the past 2 weeks have been worse though. Over this time, patient describes that her bowel movements have changed from regular solid stool to a variety between diarrhea and constipation, along with that she has been experiencing some bright red blood per rectum which can occur with or without a bowel movement. Patient tells me that this her rectal bleeding did occur month ago and she used Preparation H over-the-counter suppositories which helped this to go away after about a week, but for the past 3-4 days she has had an increased amount of bleeding and the suppositories are not helping. This is also associated with a large amount of rectal pain, even when she is just "sitting in a chair".  Also around this time the patient has developed a generalized abdominal pain which  feels like a "sharp throb". This tends to originate in the middle of her abdomen and radiate down into her right lower quadrant though it "spreads all around". This is unrelieved with a bowel movement and nothing seems to make it better or worse. This pain is not constant, but is "there more often than not". Associated with this is a feeling of nausea which occurs multiple days per week. She also experiences a large amount of bloating.  Patient also describes episodes of vomiting, typically in the morning which tastes like an "acidic material". She has also had an increase in heartburn and reflux over this time with associated dysphagia. Patient tells me that she feels like even "water gets stuck in my throat sometimes".  Patient also notes a weight gain of 5 pounds over the past week unrelated to diet or exercise. She also describes a feeling of chills.  She does explain that she was seen at [facility] 2 months ago in the ER and believes they may have done an ultrasound and labs, but was told "not to worry about her symptoms" and discharged. We do not have records of this today.  Patient denies fever, melena, change in diet, recent change in medications, recent antibiotics, weight loss, fatigue, anorexia or symptoms that awaken her at night.   Past Medical History:  Diagnosis Date  . Anemia   . GERD (gastroesophageal reflux disease)   . Hypertension    no meds   Past Surgical History:  Procedure Laterality Date  . CESAREAN SECTION  06/07/02 03/07/06   2x  . CHOLECYSTECTOMY  06/12/2012   Procedure: LAPAROSCOPIC CHOLECYSTECTOMY WITH INTRAOPERATIVE CHOLANGIOGRAM;  Surgeon: Imogene Burn. Georgette Dover, MD;  Location: [place];  Service: General;  Laterality: N/A;  . DILATION AND CURETTAGE OF UTERUS     Current Outpatient Prescriptions  Medication Sig Dispense Refill  . ciprofloxacin (CIPRO) 500 MG tablet     . HYDROmorphone (DILAUDID) 2 MG tablet Take 1 tablet (2 mg total) by mouth  every 4 (four) hours as needed for pain. 30 tablet 0  . promethazine (PHENERGAN) 12.5 MG tablet Take 1 tablet (12.5 mg total) by mouth every 6 (six) hours as needed for nausea. 30 tablet 0  . Vitamin D, Ergocalciferol, (DRISDOL) 50000 UNITS CAPS Take 50,000 Units by mouth every 7 (seven) days.     No current facility-administered medications for this visit.     Allergies as of 12/01/2015 - Review Complete 07/08/2012  Allergen Reaction Noted  . Hydrocodone  06/05/2012    Family History  Problem Relation Age of Onset  . Heart attack Mother   . Cancer Father     colon    Social History   Social History  . Marital status: Married    Spouse name: N/A  . Number of children: N/A  . Years of education: N/A   Occupational History  . Not on file.   Social History Main Topics  . Smoking status: Former Research scientist (life sciences)  . Smokeless tobacco: Former Systems developer    Quit date: 06/11/2005  . Alcohol use Yes     Comment: prn  . Drug use: No  . Sexual activity: Not on file   Other Topics Concern  . Not on file   Social History Narrative  . No narrative on file    Review of Systems:    Constitutional: Positive for weight gain and a feeling of chills No fever HEENT: Eyes: No change in vision               Ears, Nose, Throat:  No change in hearing Skin: No rash or itching Cardiovascular: No chest pain, chest pressure or palpitations   Respiratory: No SOB or cough Gastrointestinal: See HPI and otherwise negative Genitourinary: No dysuria or change in urinary frequency Neurological: Positive for headaches No dizziness or syncope Musculoskeletal: No new muscle or back pain Hematologic: No anemia or bruising Psychiatric: No history of depression or anxiety   Physical Exam:  Vital signs: BP 124/90 (BP Location: Left Arm, Patient Position: Sitting, Cuff Size: Normal)   Pulse 96   Ht 5' 1.25" (1.556 m) Comment: height measured without shoes  Wt 209 lb 4 oz (94.9 kg)   LMP 11/21/2015   BMI 39.22  kg/m   General:   Pleasant overweight Caucasian female appears to be in NAD, Well developed, Well nourished, alert and cooperative Head:  Normocephalic and atraumatic. Eyes:   PEERL, EOMI. No icterus. Conjunctiva pink. Ears:  Normal auditory acuity. Neck:  Supple Throat: Oral cavity and pharynx without inflammation, swelling or lesion. Poor dentition Lungs: Respirations even and unlabored. Lungs clear to auscultation bilaterally.   No wheezes, crackles, or rhonchi.  Heart: Normal S1, S2. No MRG. Regular rate and rhythm. No peripheral edema, cyanosis or pallor.  Abdomen:  Soft, nondistended, while palpating with my hand patient is moderately tender all over her abdomen, when palpating with stethoscope patient only seems tender in her periumbilical area, hyperactive bowel sounds No appreciable masses or hepatomegaly. Rectal:  External hemorrhoids and anal fissure, no internal pain, brown residue Msk:  Symmetrical without gross deformities. Peripheral pulses intact.  Extremities:  Without edema,  no deformity or joint abnormality.  Neurologic:  Alert and  oriented x4;  grossly normal neurologically.  Skin:   Dry and intact without significant lesions or rashes. Psychiatric: Oriented to person, place and time. Demonstrates good judgement and reason without abnormal affect or behaviors.  RELEVANT LABS AND IMAGING:  We are requesting recent labs and imaging from visit to [facility] 2 months ago per patient  Assessment: 1.  Change in bowel habits: Patient describes a change to mixture between diarrhea and constipation over the past 2 weeks; consider IBS versus relation to diet versus other 2. History of adenomatous polyps: Last colonoscopy in 2008 with recommendations for repeat in 5 years, patient is overdue  3. Rectal bleeding: On exam patient with external hemorrhoids and a fissure, this could explain patient's rectal bleeding, though cannot rule out other causes such as cancer or  diverticular disease 4. Dyspepsia: Patient describes several episodes of nausea throughout the day as well as episodes of vomiting which typically occur in the morning; consider gastritis versus functional dyspepsia versus other 5. GERD: Patient reports an acidic material in her mouth as well as heartburn on a daily basis, last an endoscopy 2008, symptoms have been occurring for "years", worse over the past 2 weeks 6. Generalized abdominal pain: On exam patient is diffusely tender, though worse in the periumbilical region; consider relation to upper reflux and gastritis symptoms versus IBS versus other 7. Dysphagia: Patient reports problems with swallowing, even "liquids sometimes"; consider esophageal ring versus stricture versus web versus mass versus dysmotility 8. External hemorrhoids: Seen at time of exam, somewhat painful to the patient, non-thrombosed  Plan: 1. Recommend the patient proceed with a colonoscopy and EGD , risks, benefits and limitations of these procedure were discussed with the patient and she agrees to proceed. 2. Prescribe diltiazem cream 2% to be applied to the rectum 2-3 times per day for at least 6-8 weeks, administration as medication was discussed in detail with the patient and she verbalizes understanding 3. Recommend the patient get some rectal care ointment over the counter and apply this as needed for pain 4. Ordered CBC and complete metabolic panel today 5. Prescribed omeprazole 40 mg once daily, 3060 minutes before eating. 6. We did review antique reflux diet and lifestyle modifications, patient was given a handout. 7. Will request recent workup from [facility] which occurred 2 months ago per patient 8. We did discuss anti-hemorrhoidal measures including decreasing straining, avoiding heavy lifting and maintaining a soft regular bowel movement 9. Follow-up appointment per Dr. Fuller Plan after time of procedures. If they're unrevealing could consider further  imaging, pending workup is ordered and done at [facility].   Thank you for your kind consultation, it was my pleasure to see this patient today.  Ellouise Newer, PA-C [facility] 12/01/2015, 12:57 PM  Cc: Dione Housekeeper, MD

## 2015-12-01 NOTE — Patient Instructions (Addendum)
Please go to the basement level to have your labs drawn.  You have been scheduled for an endoscopy and colonoscopy. Please follow the written instructions given to you at your visit today. Please pick up your prep supplies at the pharmacy within the next 1-3 days. [facility].  pharmacy.  If you use inhalers (even only as needed), please bring them with you on the day of your procedure. Your physician has requested that you go to www.startemmi.com and enter the access code given to you at your visit today. This web site gives a general overview about your procedure. However, you should still follow specific instructions given to you by our office regarding your preparation for the procedure.  We sent a prescription to [facility], Alexis Miranda.  [phone]. Diltizaem Gel 2 % You can also get over the counter Recta care with Lidocaine. You can get this at your pharmacy,  Alexis Miranda.  We sent a prescription also for Omeprazole 40 mg, take 1 tablet daily.

## 2015-12-05 ENCOUNTER — Other Ambulatory Visit: Payer: Self-pay

## 2015-12-05 ENCOUNTER — Telehealth: Payer: Self-pay | Admitting: Physician Assistant

## 2015-12-05 MED ORDER — GI COCKTAIL ~~LOC~~
ORAL | 0 refills | Status: DC
Start: 2015-12-05 — End: 2016-05-07

## 2015-12-05 NOTE — Telephone Encounter (Signed)
The patient asked that we send the prescription for the Gi Cocktail to a compounding pharmacy closer to [place], where she lives. I called [facility]. They told me the one ingredient, Donnatal, went up in price and 4 oz of it is $ 350.00.  I told the patient I will talk to [facility] and ask her if she would want another medication or recipe for the GI Cocktail.

## 2015-12-05 NOTE — Progress Notes (Signed)
Reviewed and agree with initial management plan.  Jerol Rufener T. Constant [place], MD FACG 

## 2015-12-06 NOTE — Telephone Encounter (Signed)
Spoke to [employer] PA to advise the price of the Donnatal in the GI Cocktail has increased substantially. Very unaffordable at $ 350.00 for 4 oz.  Anderson Malta said to change that to Dicyclomine ( Bentyl ) syrup. I called the pharmacy and advised the pharmacist  to do the Gi Cocktail with the formula of 20 cc of Maalox, 10 cc of Bentyl ( dicyclomine) and 20 cc of 2 % Lidocaine. He will use this formula to mix the 300 cc's we ordered for the patient. He said the Bentyl syrup will come in tomorrow 8-2. He is calling the patient to let her know when the prescription will be ready. I also called the patient to advise.

## 2015-12-20 ENCOUNTER — Telehealth: Payer: Self-pay | Admitting: Gastroenterology

## 2015-12-20 NOTE — Telephone Encounter (Signed)
I moved patients procedure up with her. I advised her that I will contact her if there are any earlier openings.

## 2016-01-04 ENCOUNTER — Ambulatory Visit: Payer: Self-pay | Admitting: Internal Medicine

## 2016-01-05 ENCOUNTER — Encounter: Payer: Self-pay | Admitting: Gastroenterology

## 2016-01-18 ENCOUNTER — Encounter: Payer: Self-pay | Admitting: Gastroenterology

## 2016-01-18 ENCOUNTER — Ambulatory Visit (AMBULATORY_SURGERY_CENTER): Payer: BLUE CROSS/BLUE SHIELD | Admitting: Gastroenterology

## 2016-01-18 VITALS — BP 110/51 | HR 91 | Temp 98.4°F | Resp 18 | Ht 61.0 in | Wt 209.0 lb

## 2016-01-18 DIAGNOSIS — Z860101 Personal history of adenomatous and serrated colon polyps: Secondary | ICD-10-CM

## 2016-01-18 DIAGNOSIS — Z86010 Personal history of colonic polyps: Secondary | ICD-10-CM

## 2016-01-18 DIAGNOSIS — K219 Gastro-esophageal reflux disease without esophagitis: Secondary | ICD-10-CM | POA: Diagnosis not present

## 2016-01-18 DIAGNOSIS — K921 Melena: Secondary | ICD-10-CM

## 2016-01-18 DIAGNOSIS — D123 Benign neoplasm of transverse colon: Secondary | ICD-10-CM | POA: Diagnosis not present

## 2016-01-18 DIAGNOSIS — R1310 Dysphagia, unspecified: Secondary | ICD-10-CM | POA: Diagnosis not present

## 2016-01-18 MED ORDER — SODIUM CHLORIDE 0.9 % IV SOLN: 500.0000 mL | INTRAVENOUS | Status: AC

## 2016-01-18 NOTE — Progress Notes (Signed)
Called to room to assist during endoscopic procedure.  Patient ID and intended procedure confirmed with present staff. Received instructions for my participation in the procedure from the performing physician.  

## 2016-01-18 NOTE — Op Note (Signed)
[place] Patient Name: Alexis Miranda Procedure Date: 01/18/2016 2:14 PM MRN: [id number] Endoscopist: Ladene Artist , MD Age: 45 Referring MD:  Date of Birth: 01-11-1971 Gender: Female Account #: [id number] Procedure:                Colonoscopy Indications:              Hematochezia Medicines:                Monitored Anesthesia Care Procedure:                Pre-Anesthesia Assessment:                           - Prior to the procedure, a History and Physical                            was performed, and patient medications and                            allergies were reviewed. The patient's tolerance of                            previous anesthesia was also reviewed. The risks                            and benefits of the procedure and the sedation                            options and risks were discussed with the patient.                            All questions were answered, and informed consent                            was obtained. Prior Anticoagulants: The patient has                            taken no previous anticoagulant or antiplatelet                            agents. ASA Grade Assessment: II - A patient with                            mild systemic disease. After reviewing the risks                            and benefits, the patient was deemed in                            satisfactory condition to undergo the procedure.                           After obtaining informed consent, the colonoscope  was passed under direct vision. Throughout the                            procedure, the patient's blood pressure, pulse, and                            oxygen saturations were monitored continuously. The                            Model PCF-H190DL [phone]) scope was introduced                            through the anus and advanced to the the cecum,                            identified by appendiceal orifice and ileocecal                             valve. The ileocecal valve, appendiceal orifice,                            and rectum were photographed. The quality of the                            bowel preparation was adequate. The colonoscopy was                            performed without difficulty. The patient tolerated                            the procedure well. Scope In: 2:21:35 PM Scope Out: 2:35:02 PM Scope Withdrawal Time: 0 hours 11 minutes 43 seconds  Total Procedure Duration: 0 hours 13 minutes 27 seconds  Findings:                 A 6 mm polyp was found in the transverse colon. The                            polyp was sessile. The polyp was removed with a                            cold snare. Resection and retrieval were complete.                           External hemorrhoids were found during                            retroflexion. The hemorrhoids were small.                           Multiple small-mouthed diverticula were found in                            the sigmoid colon.  A diffuse area of moderate melanosis was found in                            the entire colon.                           The perianal and digital rectal examinations were                            normal. Complications:            No immediate complications. Estimated blood loss:                            None. Estimated Blood Loss:     Estimated blood loss: none. Impression:               - One 6 mm polyp in the transverse colon, removed                            with a cold snare. Resected and retrieved.                           - External hemorrhoids.                           - Diverticulosis in the sigmoid colon.                           - Melanosis in the colon. Recommendation:           - Repeat colonoscopy in 5 years for surveillance.                           - Patient has a contact number available for                            emergencies. The signs and symptoms of  potential                            delayed complications were discussed with the                            patient. Return to normal activities tomorrow.                            Written discharge instructions were provided to the                            patient.                           - Resume previous diet.                           - Continue present medications.                           -  Await pathology results. Ladene Artist, MD 01/18/2016 2:47:58 PM This report has been signed electronically.

## 2016-01-18 NOTE — Patient Instructions (Signed)
Continue using your omeprazole 40mg  daily.  Notify Dr. Fuller Plan if swallowing problems continue.   YOU HAD AN ENDOSCOPIC PROCEDURE TODAY AT [facility]:   Refer to the procedure report that was given to you for any specific questions about what was found during the examination.  If the procedure report does not answer your questions, please call your gastroenterologist to clarify.  If you requested that your care partner not be given the details of your procedure findings, then the procedure report has been included in a sealed envelope for you to review at your convenience later.  YOU SHOULD EXPECT: Some feelings of bloating in the abdomen. Passage of more gas than usual.  Walking can help get rid of the air that was put into your GI tract during the procedure and reduce the bloating. If you had a lower endoscopy (such as a colonoscopy or flexible sigmoidoscopy) you may notice spotting of blood in your stool or on the toilet paper. If you underwent a bowel prep for your procedure, you may not have a normal bowel movement for a few days.  Please Note:  You might notice some irritation and congestion in your nose or some drainage.  This is from the oxygen used during your procedure.  There is no need for concern and it should clear up in a day or so.  SYMPTOMS TO REPORT IMMEDIATELY:   Following lower endoscopy (colonoscopy or flexible sigmoidoscopy):  Excessive amounts of blood in the stool  Significant tenderness or worsening of abdominal pains  Swelling of the abdomen that is new, acute  Fever of 100F or higher   Following upper endoscopy (EGD)  Vomiting of blood or coffee ground material  New chest pain or pain under the shoulder blades  Painful or persistently difficult swallowing  New shortness of breath  Fever of 100F or higher  Black, tarry-looking stools  For urgent or emergent issues, a gastroenterologist can be reached at any hour by calling [phone]  [phone].   DIET:  Clear liquids for 2 hours,until 5:00 pm.  After 5:00 until morning only soft foods.  Resume your regular diet in the AM.    ACTIVITY:  You should plan to take it easy for the rest of today and you should NOT DRIVE or use heavy machinery until tomorrow (because of the sedation medicines used during the test).    FOLLOW UP: Our staff will call the number listed on your records the next business day following your procedure to check on you and address any questions or concerns that you may have regarding the information given to you following your procedure. If we do not reach you, we will leave a message.  However, if you are feeling well and you are not experiencing any problems, there is no need to return our call.  We will assume that you have returned to your regular daily activities without incident.  If any biopsies were taken you will be contacted by phone or by letter within the next 1-3 weeks.  Please call us at [phone] if you have not heard about the biopsies in 3 weeks.    SIGNATURES/CONFIDENTIALITY: You and/or your care partner have signed paperwork which will be entered into your electronic medical record.  These signatures attest to the fact that that the information above on your After Visit Summary has been reviewed and is understood.  Full responsibility of the confidentiality of this discharge information lies with you and/or your care-partner.

## 2016-01-18 NOTE — Progress Notes (Signed)
Report to PACU, RN, vss, BBS= Clear.  

## 2016-01-18 NOTE — Op Note (Signed)
[facility] Patient Name: Alexis Miranda Procedure Date: 01/18/2016 2:13 PM MRN: [id number] Endoscopist: Ladene Artist , MD Age: 45 Referring MD:  Date of Birth: Oct 04, 1970 Gender: Female Account #: [id number] Procedure:                Upper GI endoscopy Indications:              Dysphagia, Suspected gastro-esophageal reflux                            disease Medicines:                Monitored Anesthesia Care Procedure:                Pre-Anesthesia Assessment:                           - Prior to the procedure, a History and Physical                            was performed, and patient medications and                            allergies were reviewed. The patient's tolerance of                            previous anesthesia was also reviewed. The risks                            and benefits of the procedure and the sedation                            options and risks were discussed with the patient.                            All questions were answered, and informed consent                            was obtained. Prior Anticoagulants: The patient has                            taken no previous anticoagulant or antiplatelet                            agents. ASA Grade Assessment: II - A patient with                            mild systemic disease. After reviewing the risks                            and benefits, the patient was deemed in                            satisfactory condition to undergo the procedure.  After obtaining informed consent, the endoscope was                            passed under direct vision. Throughout the                            procedure, the patient's blood pressure, pulse, and                            oxygen saturations were monitored continuously. The                            Model GIF-[id number]) scope was introduced                            through the mouth, and advanced to the second part                             of duodenum. The upper GI endoscopy was                            accomplished without difficulty. The patient                            tolerated the procedure well. Scope In: Scope Out: Findings:                 The examined esophagus was normal. A guidewire was                            placed and the scope was withdrawn. Dilation was                            performed with a Savary dilator with no resistance                            at 16 mm. Estimated blood loss: none.                           A small hiatal hernia was present.                           The exam of the stomach was otherwise normal.                           The duodenal bulb and second portion of the                            duodenum were normal. Complications:            No immediate complications. Estimated Blood Loss:     Estimated blood loss: none. Impression:               - Normal esophagus. Dilated.                           -  Small hiatal hernia.                           - Normal duodenal bulb and second portion of the                            duodenum.                           - No specimens collected. Recommendation:           - Patient has a contact number available for                            emergencies. The signs and symptoms of potential                            delayed complications were discussed with the                            patient. Return to normal activities tomorrow.                            Written discharge instructions were provided to the                            patient.                           - Clear liquid diet for 2 hours, then soft diet for                            rest of today, then advance as tolerated to resume                            previous diet.                           - Continue present medications including omeprazole                            40 mg daily.                           - Consider esophageal  manometry if dysphagia                            persists Ladene Artist, MD 01/18/2016 2:53:23 PM This report has been signed electronically.

## 2016-01-19 ENCOUNTER — Telehealth: Payer: Self-pay | Admitting: *Deleted

## 2016-01-19 NOTE — Telephone Encounter (Signed)
No answer. Name identifier. Message left to call if questions or concerns. 

## 2016-01-25 ENCOUNTER — Ambulatory Visit: Payer: Self-pay | Admitting: Gastroenterology

## 2016-01-27 ENCOUNTER — Encounter: Payer: BLUE CROSS/BLUE SHIELD | Admitting: Gastroenterology

## 2016-01-27 ENCOUNTER — Encounter: Payer: Self-pay | Admitting: Gastroenterology

## 2016-05-01 ENCOUNTER — Emergency Department (HOSPITAL_COMMUNITY)
Admission: EM | Admit: 2016-05-01 | Discharge: 2016-05-02 | Disposition: A | Discharge status: Home / Self Care | Payer: BLUE CROSS/BLUE SHIELD | Attending: Emergency Medicine | Admitting: Emergency Medicine

## 2016-05-01 ENCOUNTER — Encounter (HOSPITAL_COMMUNITY): Payer: Self-pay | Admitting: Emergency Medicine

## 2016-05-01 DIAGNOSIS — R1013 Epigastric pain: Secondary | ICD-10-CM | POA: Insufficient documentation

## 2016-05-01 DIAGNOSIS — G43809 Other migraine, not intractable, without status migrainosus: Secondary | ICD-10-CM | POA: Diagnosis not present

## 2016-05-01 DIAGNOSIS — I10 Essential (primary) hypertension: Secondary | ICD-10-CM | POA: Diagnosis not present

## 2016-05-01 DIAGNOSIS — R1011 Right upper quadrant pain: Secondary | ICD-10-CM | POA: Diagnosis not present

## 2016-05-01 DIAGNOSIS — Z79899 Other long term (current) drug therapy: Secondary | ICD-10-CM | POA: Diagnosis not present

## 2016-05-01 DIAGNOSIS — R51 Headache: Secondary | ICD-10-CM | POA: Diagnosis present

## 2016-05-01 DIAGNOSIS — Z87891 Personal history of nicotine dependence: Secondary | ICD-10-CM | POA: Diagnosis not present

## 2016-05-01 LAB — COMPREHENSIVE METABOLIC PANEL
ALK PHOS: 76 U/L (ref 38–126)
ALT: 16 U/L (ref 14–54)
AST: 18 U/L (ref 15–41)
Albumin: 3.9 g/dL (ref 3.5–5.0)
Anion gap: 8 (ref 5–15)
BUN: 11 mg/dL (ref 6–20)
CALCIUM: 9 mg/dL (ref 8.9–10.3)
CO2: 24 mmol/L (ref 22–32)
CREATININE: 0.69 mg/dL (ref 0.44–1.00)
Chloride: 105 mmol/L (ref 101–111)
Glucose, Bld: 113 mg/dL — ABNORMAL HIGH (ref 65–99)
Potassium: 3.7 mmol/L (ref 3.5–5.1)
Sodium: 137 mmol/L (ref 135–145)
TOTAL PROTEIN: 6.8 g/dL (ref 6.5–8.1)
Total Bilirubin: 0.3 mg/dL (ref 0.3–1.2)

## 2016-05-01 LAB — CBC
HCT: 35.7 % — ABNORMAL LOW (ref 36.0–46.0)
Hemoglobin: 11.9 g/dL — ABNORMAL LOW (ref 12.0–15.0)
MCH: 27.8 pg (ref 26.0–34.0)
MCHC: 33.3 g/dL (ref 30.0–36.0)
MCV: 83.4 fL (ref 78.0–100.0)
PLATELETS: 297 10*3/uL (ref 150–400)
RBC: 4.28 MIL/uL (ref 3.87–5.11)
RDW: 13.9 % (ref 11.5–15.5)
WBC: 7.2 10*3/uL (ref 4.0–10.5)

## 2016-05-01 LAB — LIPASE, BLOOD: Lipase: 32 U/L (ref 11–51)

## 2016-05-01 NOTE — ED Triage Notes (Signed)
Pt c/o migraine x 3 days, n/v. Pt also c/o R side pain that started 1 week ago.

## 2016-05-02 LAB — URINALYSIS, ROUTINE W REFLEX MICROSCOPIC
BILIRUBIN URINE: NEGATIVE
Glucose, UA: NEGATIVE mg/dL
HGB URINE DIPSTICK: NEGATIVE
KETONES UR: NEGATIVE mg/dL
Leukocytes, UA: NEGATIVE
Nitrite: NEGATIVE
PROTEIN: NEGATIVE mg/dL
SPECIFIC GRAVITY, URINE: 1.017 (ref 1.005–1.030)
pH: 6 (ref 5.0–8.0)

## 2016-05-02 LAB — PREGNANCY, URINE: PREG TEST UR: NEGATIVE

## 2016-05-02 MED ORDER — DIPHENHYDRAMINE HCL 50 MG/ML IJ SOLN
25.0000 mg | Freq: Once | INTRAMUSCULAR | Status: AC
  Administered 2016-05-02: 25 mg via INTRAVENOUS
  Filled 2016-05-02: qty 1

## 2016-05-02 MED ORDER — SODIUM CHLORIDE 0.9 % IV BOLUS (SEPSIS)
1000.0000 mL | Freq: Once | INTRAVENOUS | Status: AC
  Administered 2016-05-02: 1000 mL via INTRAVENOUS

## 2016-05-02 MED ORDER — PROCHLORPERAZINE EDISYLATE 5 MG/ML IJ SOLN
10.0000 mg | Freq: Once | INTRAMUSCULAR | Status: AC
  Administered 2016-05-02: 10 mg via INTRAVENOUS
  Filled 2016-05-02: qty 2

## 2016-05-02 MED ORDER — KETOROLAC TROMETHAMINE 30 MG/ML IJ SOLN
30.0000 mg | Freq: Once | INTRAMUSCULAR | Status: AC
Start: 2016-05-02 — End: 2016-05-02
  Administered 2016-05-02: 30 mg via INTRAVENOUS
  Filled 2016-05-02: qty 1

## 2016-05-02 MED ORDER — LORAZEPAM 2 MG/ML IJ SOLN
0.5000 mg | Freq: Once | INTRAMUSCULAR | Status: AC
  Administered 2016-05-02: 0.5 mg via INTRAVENOUS
  Filled 2016-05-02: qty 1

## 2016-05-02 NOTE — ED Provider Notes (Signed)
[facility] DEPT Provider Note   CSN: [id number] Arrival date & time: 05/01/16  2028     History   Chief Complaint Chief Complaint  Patient presents with  . Migraine  . Abdominal Pain    HPI Alexis Miranda is a 45 y.o. female with 2 complaints, migraine headache and RUQ abdominal pain.   The patient has a history of migraine and the current symptoms are similar to previous episodes of migraine headache except for timing.  She describes nearly daily migraines but lasting for a few hours to most of the day.  Currently her pain has been present for 3 days and has not responded to her usual tylenol.  She reports a distant history of head trauma induced seizures which resolved but left her with persistent migraines.  She was followed by a neurologist in [facility] who formally diagnosed her with migraines.    The patient has right sided pain in association with photophobia, phonophobia, nausea without emesis.  There has been no fevers, chills, syncope, confusion or localized weakness.  The patient has tried tylenol without relief of symptoms.  She historically has tried classic migraine medicines including imitrex, zomig and topamax and "nothing works".   Additionally she reports right upper quadrant non radiating pain which has been intermittent for months, she woke with this current episode yesterday morning.  She denies a pattern of headache and abdominal pain being linked symptoms but states these abdominal pain episodes are almost as frequent as the headaches.  She denies abdominal distention, vomiting, diarrhea or constipation.  She denies dysuria or hematuria.  .  The history is provided by the patient.    Past Medical History:  Diagnosis Date  . Anemia   . Chronic headaches   . Colon polyps   . DVT (deep venous thrombosis) ([place])   . Gallstones   . GERD (gastroesophageal reflux disease)   . Hypertension    no meds  . IBS (irritable bowel syndrome)   . Pneumonia   .  Seizures ([place])   . Uterine cyst     Patient Active Problem List   Diagnosis Date Noted  . Chronic cholecystitis with calculus 06/11/2012    Past Surgical History:  Procedure Laterality Date  . CESAREAN SECTION  06/07/02 03/07/06   2x  . CHOLECYSTECTOMY  06/12/2012   Procedure: LAPAROSCOPIC CHOLECYSTECTOMY WITH INTRAOPERATIVE CHOLANGIOGRAM;  Surgeon: Imogene Burn. Georgette Dover, MD;  Location: [facility];  Service: General;  Laterality: N/A;  . DILATION AND CURETTAGE OF UTERUS      OB History    Gravida Para Term Preterm AB Living   4 2 2   2      SAB TAB Ectopic Multiple Live Births   2               Home Medications    Prior to Admission medications   Medication Sig Start Date End Date Taking? Authorizing Provider  Alum & Mag Hydroxide-Simeth (GI COCKTAIL) SUSP suspension Take 5-10 ml q 4 hours as needed for pain 12/05/15   Levin Erp, PA  omeprazole (PRILOSEC) 40 MG capsule Take 1 capsule (40 mg total) by mouth daily. 12/01/15   Levin Erp, PA    Family History Family History  Problem Relation Age of Onset  . Heart attack Mother   . Colon polyps Mother   . Clotting disorder Mother   . Diabetes Mother   . Heart disease Mother   . Cancer Father  colon  . Colon cancer Father   . Clotting disorder Father   . Diabetes Father   . Heart disease Father   . Clotting disorder Brother   . Heart disease Brother   . Diabetes Paternal Grandmother   . Heart attack Paternal Grandmother   . Hypertension Paternal Grandmother     Social History Social History  Substance Use Topics  . Smoking status: Former Smoker    Quit date: 06/11/2005  . Smokeless tobacco: Never Used  . Alcohol use No     Allergies   Hydrocodone   Review of Systems Review of Systems  Constitutional: Negative for chills and fever.  HENT: Negative.  Negative for congestion, rhinorrhea, sinus pain, sinus pressure and sore throat.   Eyes: Positive for photophobia.    Respiratory: Negative for chest tightness and shortness of breath.   Cardiovascular: Negative for chest pain.  Gastrointestinal: Positive for abdominal pain and nausea. Negative for abdominal distention, constipation, diarrhea and vomiting.  Genitourinary: Negative.   Musculoskeletal: Negative for arthralgias, joint swelling and neck pain.  Skin: Negative.  Negative for rash and wound.  Neurological: Positive for headaches. Negative for dizziness, seizures, weakness, light-headedness and numbness.  Psychiatric/Behavioral: Negative.      Physical Exam Updated Vital Signs BP 155/90   Pulse 88   Temp 98.7 F (37.1 C)   Resp 17   Ht 5\' 5"  (1.651 m)   Wt 93 kg   LMP 04/14/2016   SpO2 100%   BMI 34.11 kg/m   Physical Exam  Constitutional: She is oriented to person, place, and time. She appears well-developed and well-nourished.  Uncomfortable appearing  HENT:  Head: Normocephalic and atraumatic.  Mouth/Throat: Oropharynx is clear and moist.  Eyes: EOM are normal. Pupils are equal, round, and reactive to light.  Neck: Normal range of motion. Neck supple.  Cardiovascular: Normal rate and normal heart sounds.   Pulmonary/Chest: Effort normal.  Abdominal: Soft. There is no hepatosplenomegaly. There is tenderness in the right upper quadrant and epigastric area. There is no rigidity, no rebound, no guarding and no CVA tenderness.  Musculoskeletal: Normal range of motion.  Lymphadenopathy:    She has no cervical adenopathy.  Neurological: She is alert and oriented to person, place, and time. She has normal strength. No sensory deficit. Gait normal. GCS eye subscore is 4. GCS verbal subscore is 5. GCS motor subscore is 6.  Normal heel-shin, normal rapid alternating movements. Cranial nerves III-XII intact.  No pronator drift.  Skin: Skin is warm and dry. No rash noted.  Psychiatric: She has a normal mood and affect. Her speech is normal and behavior is normal. Thought content normal.  Cognition and memory are normal.  Nursing note and vitals reviewed.    ED Treatments / Results  Labs  Results for orders placed or performed during the hospital encounter of 05/01/16  Lipase, blood  Result Value Ref Range   Lipase 32 11 - 51 U/L  Comprehensive metabolic panel  Result Value Ref Range   Sodium 137 135 - 145 mmol/L   Potassium 3.7 3.5 - 5.1 mmol/L   Chloride 105 101 - 111 mmol/L   CO2 24 22 - 32 mmol/L   Glucose, Bld 113 (H) 65 - 99 mg/dL   BUN 11 6 - 20 mg/dL   Creatinine, Ser 0.69 0.44 - 1.00 mg/dL   Calcium 9.0 8.9 - 10.3 mg/dL   Total Protein 6.8 6.5 - 8.1 g/dL   Albumin 3.9 3.5 - 5.0 g/dL  AST 18 15 - 41 U/L   ALT 16 14 - 54 U/L   Alkaline Phosphatase 76 38 - 126 U/L   Total Bilirubin 0.3 0.3 - 1.2 mg/dL   GFR calc non Af Amer >60 >60 mL/min   GFR calc Af Amer >60 >60 mL/min   Anion gap 8 5 - 15  CBC  Result Value Ref Range   WBC 7.2 4.0 - 10.5 K/uL   RBC 4.28 3.87 - 5.11 MIL/uL   Hemoglobin 11.9 (L) 12.0 - 15.0 g/dL   HCT 35.7 (L) 36.0 - 46.0 %   MCV 83.4 78.0 - 100.0 fL   MCH 27.8 26.0 - 34.0 pg   MCHC 33.3 30.0 - 36.0 g/dL   RDW 13.9 11.5 - 15.5 %   Platelets 297 150 - 400 K/uL   No results found.   EKG  EKG Interpretation None       Radiology No results found.  Procedures Procedures (including critical care time)  Medications Ordered in ED Medications  sodium chloride 0.9 % bolus 1,000 mL (not administered)  prochlorperazine (COMPAZINE) injection 10 mg (not administered)  diphenhydrAMINE (BENADRYL) injection 25 mg (not administered)  ketorolac (TORADOL) 30 MG/ML injection 30 mg (not administered)     Initial Impression / Assessment and Plan / ED Course  I have reviewed the triage vital signs and the nursing notes.  Pertinent labs & imaging results that were available during my care of the patient were reviewed by me and considered in my medical decision making (see chart for details).  Clinical Course     Pt with  chronic migraines and RUQ abdominal pain. Pending UA results currently.  She has received migraine cocktail with mild headache improvement, c/o feeling restless since tx.  Will add small dose of ativan.    Pending UA results.  Discussed with Dr. Venora Maples who will follow and dispo.   Final Clinical Impressions(s) / ED Diagnoses   Final diagnoses:  None    New Prescriptions New Prescriptions   No medications on file     Landis Martins 05/02/16 0138    Jola Schmidt, MD 05/02/16 [id number]    Jola Schmidt, MD 05/02/16 [phone]

## 2016-05-07 ENCOUNTER — Emergency Department (HOSPITAL_COMMUNITY)
Admission: EM | Admit: 2016-05-07 | Discharge: 2016-05-07 | Disposition: A | Discharge status: Home / Self Care | Payer: BLUE CROSS/BLUE SHIELD | Attending: Emergency Medicine | Admitting: Emergency Medicine

## 2016-05-07 ENCOUNTER — Emergency Department (HOSPITAL_COMMUNITY): Payer: BLUE CROSS/BLUE SHIELD

## 2016-05-07 DIAGNOSIS — Z7982 Long term (current) use of aspirin: Secondary | ICD-10-CM | POA: Diagnosis not present

## 2016-05-07 DIAGNOSIS — N3000 Acute cystitis without hematuria: Secondary | ICD-10-CM | POA: Diagnosis not present

## 2016-05-07 DIAGNOSIS — R1011 Right upper quadrant pain: Secondary | ICD-10-CM

## 2016-05-07 DIAGNOSIS — Z87891 Personal history of nicotine dependence: Secondary | ICD-10-CM | POA: Insufficient documentation

## 2016-05-07 DIAGNOSIS — R112 Nausea with vomiting, unspecified: Secondary | ICD-10-CM | POA: Diagnosis not present

## 2016-05-07 DIAGNOSIS — Z79899 Other long term (current) drug therapy: Secondary | ICD-10-CM | POA: Insufficient documentation

## 2016-05-07 DIAGNOSIS — I10 Essential (primary) hypertension: Secondary | ICD-10-CM | POA: Diagnosis not present

## 2016-05-07 DIAGNOSIS — R109 Unspecified abdominal pain: Secondary | ICD-10-CM | POA: Diagnosis present

## 2016-05-07 LAB — HEPATIC FUNCTION PANEL
ALBUMIN: 3.3 g/dL — AB (ref 3.5–5.0)
ALK PHOS: 71 U/L (ref 38–126)
ALT: 17 U/L (ref 14–54)
AST: 19 U/L (ref 15–41)
Bilirubin, Direct: <0.1 mg/dL — ABNORMAL LOW (ref 0.1–0.5)
TOTAL PROTEIN: 6.6 g/dL (ref 6.5–8.1)
Total Bilirubin: 0.5 mg/dL (ref 0.3–1.2)

## 2016-05-07 LAB — CBC
HEMATOCRIT: 34.6 % — AB (ref 36.0–46.0)
HEMOGLOBIN: 11.9 g/dL — AB (ref 12.0–15.0)
MCH: 27.5 pg (ref 26.0–34.0)
MCHC: 34.4 g/dL (ref 30.0–36.0)
MCV: 80.1 fL (ref 78.0–100.0)
Platelets: 330 10*3/uL (ref 150–400)
RBC: 4.32 MIL/uL (ref 3.87–5.11)
RDW: 13.5 % (ref 11.5–15.5)
WBC: 6.7 10*3/uL (ref 4.0–10.5)

## 2016-05-07 LAB — I-STAT CHEM 8, ED
BUN: 10 mg/dL (ref 6–20)
CHLORIDE: 107 mmol/L (ref 101–111)
Calcium, Ion: 1.15 mmol/L (ref 1.15–1.40)
Creatinine, Ser: 0.5 mg/dL (ref 0.44–1.00)
Glucose, Bld: 115 mg/dL — ABNORMAL HIGH (ref 65–99)
HCT: 33 % — ABNORMAL LOW (ref 36.0–46.0)
Hemoglobin: 11.2 g/dL — ABNORMAL LOW (ref 12.0–15.0)
POTASSIUM: 3.8 mmol/L (ref 3.5–5.1)
SODIUM: 139 mmol/L (ref 135–145)
TCO2: 21 mmol/L (ref 0–100)

## 2016-05-07 LAB — URINALYSIS, ROUTINE W REFLEX MICROSCOPIC
Bilirubin Urine: NEGATIVE
Glucose, UA: NEGATIVE mg/dL
Hgb urine dipstick: NEGATIVE
KETONES UR: NEGATIVE mg/dL
Leukocytes, UA: NEGATIVE
Nitrite: NEGATIVE
PROTEIN: 30 mg/dL — AB
Specific Gravity, Urine: 1.034 — ABNORMAL HIGH (ref 1.005–1.030)
pH: 5 (ref 5.0–8.0)

## 2016-05-07 LAB — POC URINE PREG, ED: PREG TEST UR: NEGATIVE

## 2016-05-07 MED ORDER — ONDANSETRON 4 MG PO TBDP: 4.0000 mg | ORAL_TABLET | Freq: Three times a day (TID) | ORAL | 0 refills | Status: DC | PRN

## 2016-05-07 MED ORDER — OXYCODONE-ACETAMINOPHEN 5-325 MG PO TABS: 1.0000 | ORAL_TABLET | ORAL | 0 refills | Status: DC | PRN

## 2016-05-07 MED ORDER — ONDANSETRON HCL 4 MG/2ML IJ SOLN
4.0000 mg | Freq: Once | INTRAMUSCULAR | Status: AC
  Administered 2016-05-07: 4 mg via INTRAVENOUS
  Filled 2016-05-07: qty 2

## 2016-05-07 MED ORDER — PROMETHAZINE HCL 25 MG/ML IJ SOLN
25.0000 mg | Freq: Once | INTRAMUSCULAR | Status: AC
  Administered 2016-05-07: 25 mg via INTRAVENOUS
  Filled 2016-05-07: qty 1

## 2016-05-07 MED ORDER — SULFAMETHOXAZOLE-TRIMETHOPRIM 800-160 MG PO TABS: 1.0000 | ORAL_TABLET | Freq: Two times a day (BID) | ORAL | 0 refills | Status: AC

## 2016-05-07 MED ORDER — HYDROMORPHONE HCL 2 MG/ML IJ SOLN
1.0000 mg | Freq: Once | INTRAMUSCULAR | Status: AC
Start: 2016-05-07 — End: 2016-05-07
  Administered 2016-05-07: 1 mg via INTRAVENOUS
  Filled 2016-05-07: qty 1

## 2016-05-07 MED ORDER — HYDROMORPHONE HCL 2 MG/ML IJ SOLN
1.0000 mg | Freq: Once | INTRAMUSCULAR | Status: AC
  Administered 2016-05-07: 1 mg via INTRAVENOUS
  Filled 2016-05-07: qty 1

## 2016-05-07 MED ORDER — SODIUM CHLORIDE 0.9 % IV BOLUS (SEPSIS)
1000.0000 mL | Freq: Once | INTRAVENOUS | Status: AC
  Administered 2016-05-07: 1000 mL via INTRAVENOUS

## 2016-05-07 MED ORDER — FAMOTIDINE 20 MG PO TABS: 20.0000 mg | ORAL_TABLET | Freq: Two times a day (BID) | ORAL | 0 refills | Status: DC

## 2016-05-07 MED ORDER — MORPHINE SULFATE (PF) 4 MG/ML IV SOLN
4.0000 mg | Freq: Once | INTRAVENOUS | Status: AC
  Administered 2016-05-07: 4 mg via INTRAVENOUS
  Filled 2016-05-07: qty 1

## 2016-05-07 MED ORDER — GABAPENTIN 300 MG PO CAPS: 300.0000 mg | ORAL_CAPSULE | Freq: Every day | ORAL | 0 refills | Status: DC

## 2016-05-07 MED ORDER — FAMOTIDINE IN NACL 20-0.9 MG/50ML-% IV SOLN
20.0000 mg | Freq: Once | INTRAVENOUS | Status: AC
  Administered 2016-05-07: 20 mg via INTRAVENOUS
  Filled 2016-05-07: qty 50

## 2016-05-07 NOTE — ED Provider Notes (Signed)
[facility] DEPT Provider Note   CSN: [id number] Arrival date & time: 05/07/16  1209     History   Chief Complaint Chief Complaint  Patient presents with  . Flank Pain    HPI Alexis Miranda is a 46 y.o. female.  Pt presents to the ED today with right sided flank pain, n/v, and fevers.  The pt said it's been going on for about 2 weeks and it's become unbearable.  The pt had a fever yesterday which resolved with tylenol.  Looking at old charts, pt has had this pain for years.  She had a recent endoscopy and colonoscopy with Dr. Fuller Plan in September.        Past Medical History:  Diagnosis Date  . Anemia   . Chronic headaches   . Colon polyps   . DVT (deep venous thrombosis) ([place])   . Gallstones   . GERD (gastroesophageal reflux disease)   . Hypertension    no meds  . IBS (irritable bowel syndrome)   . Pneumonia   . Seizures ([place])   . Uterine cyst     Patient Active Problem List   Diagnosis Date Noted  . Chronic cholecystitis with calculus 06/11/2012    Past Surgical History:  Procedure Laterality Date  . CESAREAN SECTION  06/07/02 03/07/06   2x  . CHOLECYSTECTOMY  06/12/2012   Procedure: LAPAROSCOPIC CHOLECYSTECTOMY WITH INTRAOPERATIVE CHOLANGIOGRAM;  Surgeon: Imogene Burn. Georgette Dover, MD;  Location: [facility];  Service: General;  Laterality: N/A;  . DILATION AND CURETTAGE OF UTERUS     BTL  OB History    Gravida Para Term Preterm AB Living   4 2 2   2      SAB TAB Ectopic Multiple Live Births   2               Home Medications    Prior to Admission medications   Medication Sig Start Date End Date Taking? Authorizing Provider  acetaminophen (TYLENOL) 500 MG tablet Take 1,500 mg by mouth every 6 (six) hours as needed for mild pain.   Yes Historical Provider, MD  aspirin EC 81 MG tablet Take 81 mg by mouth daily.   Yes Historical Provider, MD  famotidine (PEPCID) 20 MG tablet Take 1 tablet (20 mg total) by mouth 2 (two) times daily. 05/07/16   Isla Pence, MD  gabapentin (NEURONTIN) 300 MG capsule Take 1 capsule (300 mg total) by mouth at bedtime. 05/07/16   Isla Pence, MD  omeprazole (PRILOSEC) 40 MG capsule Take 1 capsule (40 mg total) by mouth daily. Patient not taking: Reported on 05/07/2016 12/01/15   Levin Erp, PA  ondansetron (ZOFRAN ODT) 4 MG disintegrating tablet Take 1 tablet (4 mg total) by mouth every 8 (eight) hours as needed for nausea or vomiting. 05/07/16   Isla Pence, MD  oxyCODONE-acetaminophen (PERCOCET/ROXICET) 5-325 MG tablet Take 1 tablet by mouth every 4 (four) hours as needed for severe pain. 05/07/16   Isla Pence, MD    Family History Family History  Problem Relation Age of Onset  . Heart attack Mother   . Colon polyps Mother   . Clotting disorder Mother   . Diabetes Mother   . Heart disease Mother   . Cancer Father     colon  . Colon cancer Father   . Clotting disorder Father   . Diabetes Father   . Heart disease Father   . Clotting disorder Brother   . Heart disease Brother   .  Diabetes Paternal Grandmother   . Heart attack Paternal Grandmother   . Hypertension Paternal Grandmother     Social History Social History  Substance Use Topics  . Smoking status: Former Smoker    Quit date: 06/11/2005  . Smokeless tobacco: Never Used  . Alcohol use No     Allergies   Hydrocodone   Review of Systems Review of Systems  Constitutional: Positive for fever.  Gastrointestinal: Positive for abdominal pain, nausea and vomiting.  All other systems reviewed and are negative.    Physical Exam Updated Vital Signs BP 144/91   Pulse 82   Temp 98.7 F (37.1 C) (Oral)   Ht 5\' 1"  (1.549 m)   Wt 203 lb (92.1 kg)   LMP 04/14/2016   SpO2 99%   BMI 38.36 kg/m   Physical Exam  Constitutional: She is oriented to person, place, and time. She appears well-developed and well-nourished.  HENT:  Head: Normocephalic and atraumatic.  Right Ear: External ear normal.  Left Ear: External ear  normal.  Nose: Nose normal.  Mouth/Throat: Oropharynx is clear and moist.  Eyes: Conjunctivae and EOM are normal. Pupils are equal, round, and reactive to light.  Neck: Normal range of motion. Neck supple.  Cardiovascular: Normal rate, regular rhythm, normal heart sounds and intact distal pulses.   Pulmonary/Chest: Effort normal and breath sounds normal.  Abdominal: There is tenderness in the right upper quadrant and right lower quadrant. There is CVA tenderness.  Musculoskeletal: Normal range of motion.  Neurological: She is alert and oriented to person, place, and time.  Skin: Skin is warm.  Psychiatric: She has a normal mood and affect. Her behavior is normal. Judgment and thought content normal.  Nursing note and vitals reviewed.    ED Treatments / Results  Labs (all labs ordered are listed, but only abnormal results are displayed) Labs Reviewed  URINALYSIS, ROUTINE W REFLEX MICROSCOPIC - Abnormal; Notable for the following:       Result Value   APPearance CLOUDY (*)    Specific Gravity, Urine 1.034 (*)    Protein, ur 30 (*)    Bacteria, UA MANY (*)    Squamous Epithelial / LPF 6-30 (*)    All other components within normal limits  CBC - Abnormal; Notable for the following:    Hemoglobin 11.9 (*)    HCT 34.6 (*)    All other components within normal limits  HEPATIC FUNCTION PANEL - Abnormal; Notable for the following:    Albumin 3.3 (*)    Bilirubin, Direct <0.1 (*)    All other components within normal limits  I-STAT CHEM 8, ED - Abnormal; Notable for the following:    Glucose, Bld 115 (*)    Hemoglobin 11.2 (*)    HCT 33.0 (*)    All other components within normal limits  POC URINE PREG, ED    EKG  EKG Interpretation None       Radiology Ct Renal Stone Study  Result Date: 05/07/2016 CLINICAL DATA:  Right flank pain and nausea and vomiting and fever. EXAM: CT ABDOMEN AND PELVIS WITHOUT CONTRAST TECHNIQUE: Multidetector CT imaging of the abdomen and pelvis was  performed following the standard protocol without IV contrast. COMPARISON:  None. FINDINGS: Lower chest: Normal. Hepatobiliary: No focal liver abnormality is seen. Status post cholecystectomy. No biliary dilatation. Pancreas: Unremarkable. No pancreatic ductal dilatation or surrounding inflammatory changes. Spleen: Normal in size without focal abnormality. Adrenals/Urinary Tract: Adrenal glands are normal. Marked atrophy right kidney with dystrophic  calcification. Compensatory hypertrophy of the left kidney. No hydronephrosis. Bladder is normal. Stomach/Bowel: Stomach is within normal limits. Appendix appears normal. No evidence of bowel wall thickening, distention, or inflammatory changes. There are few scattered diverticula in the descending colon. Vascular/Lymphatic: No significant vascular findings are present. No enlarged abdominal or pelvic lymph nodes. Reproductive: 4 cm fibroid at the left superolateral aspect of the uterus. 13 mm cyst on the otherwise normal left ovary. Normal right ovary. Tubal ligation clips in place. Other: No abdominal wall hernia or abnormality. No abdominopelvic ascites. Musculoskeletal: No acute or significant osseous findings. IMPRESSION: Benign-appearing abdomen. Longstanding severe atrophy of the right kidney with compensatory hypertrophy of the left kidney. Electronically Signed   By: Lorriane Shire M.D.   On: 05/07/2016 14:17    Procedures Procedures (including critical care time)  Medications Ordered in ED Medications  promethazine (PHENERGAN) injection 25 mg (not administered)  sodium chloride 0.9 % bolus 1,000 mL (1,000 mLs Intravenous New Bag/Given 05/07/16 1327)  morphine 4 MG/ML injection 4 mg (4 mg Intravenous Given 05/07/16 1327)  ondansetron (ZOFRAN) injection 4 mg (4 mg Intravenous Given 05/07/16 1327)  HYDROmorphone (DILAUDID) injection 1 mg (1 mg Intravenous Given 05/07/16 1436)  famotidine (PEPCID) IVPB 20 mg premix (0 mg Intravenous Stopped 05/07/16 1606)    HYDROmorphone (DILAUDID) injection 1 mg (1 mg Intravenous Given 05/07/16 1523)  ondansetron (ZOFRAN) injection 4 mg (4 mg Intravenous Given 05/07/16 1559)     Initial Impression / Assessment and Plan / ED Course  I have reviewed the triage vital signs and the nursing notes.  Pertinent labs & imaging results that were available during my care of the patient were reviewed by me and considered in my medical decision making (see chart for details).  Clinical Course    Pt wants me to figure out why she has had chronic abdominal pain.  She is upset that I am unable to give her an answer now.  The pt's sx are c/w gallbladder colic, but her gallbladder is gone and her LFTs are normal.  Pt will be d/c to f/u with GI.  Final Clinical Impressions(s) / ED Diagnoses   Final diagnoses:  Right upper quadrant abdominal pain  Acute cystitis without hematuria  Non-intractable vomiting with nausea, unspecified vomiting type    New Prescriptions New Prescriptions   FAMOTIDINE (PEPCID) 20 MG TABLET    Take 1 tablet (20 mg total) by mouth 2 (two) times daily.   GABAPENTIN (NEURONTIN) 300 MG CAPSULE    Take 1 capsule (300 mg total) by mouth at bedtime.   ONDANSETRON (ZOFRAN ODT) 4 MG DISINTEGRATING TABLET    Take 1 tablet (4 mg total) by mouth every 8 (eight) hours as needed for nausea or vomiting.   OXYCODONE-ACETAMINOPHEN (PERCOCET/ROXICET) 5-325 MG TABLET    Take 1 tablet by mouth every 4 (four) hours as needed for severe pain.     Isla Pence, MD 05/07/16 [phone]

## 2016-05-07 NOTE — ED Notes (Signed)
Pt vomiting. Pt requesting more nausea medications. MD notified. Will await orders.

## 2016-05-07 NOTE — ED Triage Notes (Signed)
To ED for eval of right flank pain with radiation to abd. Started 3 wks ago with increased pain. No n/v due to pain. Fevers up to 101 at home. Pain with urination

## 2016-05-07 NOTE — ED Notes (Signed)
Spoke with Main lab. They will add on hepatic function.

## 2016-05-07 NOTE — ED Notes (Signed)
Waste 1mg  of Dilaudid with Derek Mound, RN.

## 2016-05-14 ENCOUNTER — Encounter (HOSPITAL_COMMUNITY): Payer: Self-pay | Admitting: *Deleted

## 2016-05-14 DIAGNOSIS — I10 Essential (primary) hypertension: Secondary | ICD-10-CM | POA: Diagnosis not present

## 2016-05-14 DIAGNOSIS — Z5321 Procedure and treatment not carried out due to patient leaving prior to being seen by health care provider: Secondary | ICD-10-CM | POA: Diagnosis not present

## 2016-05-14 DIAGNOSIS — Z7982 Long term (current) use of aspirin: Secondary | ICD-10-CM | POA: Diagnosis not present

## 2016-05-14 DIAGNOSIS — R109 Unspecified abdominal pain: Secondary | ICD-10-CM | POA: Insufficient documentation

## 2016-05-14 DIAGNOSIS — Z87891 Personal history of nicotine dependence: Secondary | ICD-10-CM | POA: Insufficient documentation

## 2016-05-14 LAB — URINALYSIS, ROUTINE W REFLEX MICROSCOPIC
Bilirubin Urine: NEGATIVE
Glucose, UA: NEGATIVE mg/dL
Ketones, ur: NEGATIVE mg/dL
Nitrite: NEGATIVE
PH: 6 (ref 5.0–8.0)
Protein, ur: 30 mg/dL — AB
SPECIFIC GRAVITY, URINE: 1.019 (ref 1.005–1.030)

## 2016-05-14 NOTE — ED Triage Notes (Signed)
Seen previously x 2 for same symptoms

## 2016-05-15 ENCOUNTER — Emergency Department (HOSPITAL_COMMUNITY)
Admission: EM | Admit: 2016-05-15 | Discharge: 2016-05-15 | Disposition: A | Discharge status: Home / Self Care | Payer: BLUE CROSS/BLUE SHIELD | Attending: Dermatology | Admitting: Dermatology

## 2016-05-23 ENCOUNTER — Emergency Department (HOSPITAL_COMMUNITY)
Admission: EM | Admit: 2016-05-23 | Discharge: 2016-05-23 | Disposition: A | Discharge status: Home / Self Care | Payer: BLUE CROSS/BLUE SHIELD | Attending: Emergency Medicine | Admitting: Emergency Medicine

## 2016-05-23 ENCOUNTER — Encounter (HOSPITAL_COMMUNITY): Payer: Self-pay | Admitting: Family Medicine

## 2016-05-23 DIAGNOSIS — Z87891 Personal history of nicotine dependence: Secondary | ICD-10-CM | POA: Insufficient documentation

## 2016-05-23 DIAGNOSIS — I10 Essential (primary) hypertension: Secondary | ICD-10-CM | POA: Diagnosis not present

## 2016-05-23 DIAGNOSIS — N3000 Acute cystitis without hematuria: Secondary | ICD-10-CM | POA: Insufficient documentation

## 2016-05-23 DIAGNOSIS — Z79899 Other long term (current) drug therapy: Secondary | ICD-10-CM | POA: Diagnosis not present

## 2016-05-23 DIAGNOSIS — Z7982 Long term (current) use of aspirin: Secondary | ICD-10-CM | POA: Diagnosis not present

## 2016-05-23 DIAGNOSIS — R112 Nausea with vomiting, unspecified: Secondary | ICD-10-CM | POA: Diagnosis present

## 2016-05-23 LAB — BASIC METABOLIC PANEL
Anion gap: 9 (ref 5–15)
BUN: 15 mg/dL (ref 6–20)
CALCIUM: 8.8 mg/dL — AB (ref 8.9–10.3)
CO2: 23 mmol/L (ref 22–32)
CREATININE: 0.69 mg/dL (ref 0.44–1.00)
Chloride: 105 mmol/L (ref 101–111)
GFR calc non Af Amer: >60 mL/min (ref >60)
Glucose, Bld: 97 mg/dL (ref 65–99)
Potassium: 3.7 mmol/L (ref 3.5–5.1)
SODIUM: 137 mmol/L (ref 135–145)

## 2016-05-23 LAB — CBC WITH DIFFERENTIAL/PLATELET
BASOS PCT: 0 %
Basophils Absolute: 0 10*3/uL (ref 0.0–0.1)
EOS ABS: 0.1 10*3/uL (ref 0.0–0.7)
EOS PCT: 1 %
HCT: 35.8 % — ABNORMAL LOW (ref 36.0–46.0)
HEMOGLOBIN: 12 g/dL (ref 12.0–15.0)
LYMPHS ABS: 1.4 10*3/uL (ref 0.7–4.0)
Lymphocytes Relative: 15 %
MCH: 26.9 pg (ref 26.0–34.0)
MCHC: 33.5 g/dL (ref 30.0–36.0)
MCV: 80.3 fL (ref 78.0–100.0)
MONOS PCT: 8 %
Monocytes Absolute: 0.7 10*3/uL (ref 0.1–1.0)
NEUTROS PCT: 76 %
Neutro Abs: 7.2 10*3/uL (ref 1.7–7.7)
PLATELETS: 284 10*3/uL (ref 150–400)
RBC: 4.46 MIL/uL (ref 3.87–5.11)
RDW: 13.6 % (ref 11.5–15.5)
WBC: 9.4 10*3/uL (ref 4.0–10.5)

## 2016-05-23 LAB — URINALYSIS, ROUTINE W REFLEX MICROSCOPIC
Bilirubin Urine: NEGATIVE
Glucose, UA: NEGATIVE mg/dL
Ketones, ur: NEGATIVE mg/dL
NITRITE: NEGATIVE
PROTEIN: NEGATIVE mg/dL
SPECIFIC GRAVITY, URINE: 1.012 (ref 1.005–1.030)
pH: 5 (ref 5.0–8.0)

## 2016-05-23 LAB — I-STAT BETA HCG BLOOD, ED (MC, WL, AP ONLY)

## 2016-05-23 MED ORDER — KETOROLAC TROMETHAMINE 15 MG/ML IJ SOLN
15.0000 mg | Freq: Once | INTRAMUSCULAR | Status: AC
  Administered 2016-05-23: 15 mg via INTRAVENOUS
  Filled 2016-05-23: qty 1

## 2016-05-23 MED ORDER — ETODOLAC 300 MG PO CAPS: 300.0000 mg | ORAL_CAPSULE | Freq: Three times a day (TID) | ORAL | 0 refills | Status: DC

## 2016-05-23 MED ORDER — CEPHALEXIN 500 MG PO CAPS: 500.0000 mg | ORAL_CAPSULE | Freq: Four times a day (QID) | ORAL | 0 refills | Status: AC

## 2016-05-23 MED ORDER — FENTANYL CITRATE (PF) 100 MCG/2ML IJ SOLN
50.0000 ug | Freq: Once | INTRAMUSCULAR | Status: AC
  Administered 2016-05-23: 50 ug via INTRAVENOUS
  Filled 2016-05-23: qty 2

## 2016-05-23 MED ORDER — DEXTROSE 5 % IV SOLN
1.0000 g | Freq: Once | INTRAVENOUS | Status: AC
  Administered 2016-05-23: 1 g via INTRAVENOUS
  Filled 2016-05-23: qty 10

## 2016-05-23 MED ORDER — ONDANSETRON HCL 4 MG/2ML IJ SOLN
4.0000 mg | Freq: Once | INTRAMUSCULAR | Status: AC
  Administered 2016-05-23: 4 mg via INTRAVENOUS
  Filled 2016-05-23: qty 2

## 2016-05-23 NOTE — ED Triage Notes (Signed)
Patient is complaining of lower back pain with burning with urination, increase urgency and frequency. Also states fever 100.0 at home. Pt reports she has took OTC pain medication. Last dose: Tylenol 3 tablets about 3 hour ago.

## 2016-05-23 NOTE — Discharge Instructions (Signed)
Make sure to take all the antibiotics until they are completed. Urine culture was sent today to make sure that the bacteria will be sensitive to the Keflex. Follow up with a primary care doctor in the next week or so to have your urine retested to make sure the infection has resolved.

## 2016-05-23 NOTE — ED Provider Notes (Signed)
[facility] DEPT Provider Note   CSN: [id number] Arrival date & time: 05/23/16  [id number]     History   Chief Complaint Chief Complaint  Patient presents with  . Back Pain    HPI Alexis Miranda is a 46 y.o. female.  HPI Patient presents to the emergency room with complaints of persistent back pain. Patient states his symptoms started several weeks ago at least.  Patient states in the last few days she started having trouble with low-grade fevers up to 100. She's also had some nausea and vomiting. She had noticed prior to those symptoms urinary urgency and frequency associated with burning with urination. Patient states she is been seen a couple of times previously in emergency rooms for this. One time she was diagnosed with a urinary tract infection. She did take a course of antibiotics per her symptoms have not improved. Patient has not followed up with a primary care doctor since her ER visits. Past Medical History:  Diagnosis Date  . Anemia   . Chronic headaches   . Colon polyps   . DVT (deep venous thrombosis) ([facility])   . Gallstones   . GERD (gastroesophageal reflux disease)   . Hypertension    no meds  . IBS (irritable bowel syndrome)   . Pneumonia   . Seizures ([place])   . Uterine cyst     Patient Active Problem List   Diagnosis Date Noted  . Chronic cholecystitis with calculus 06/11/2012    Past Surgical History:  Procedure Laterality Date  . CESAREAN SECTION  06/07/02 03/07/06   2x  . CHOLECYSTECTOMY  06/12/2012   Procedure: LAPAROSCOPIC CHOLECYSTECTOMY WITH INTRAOPERATIVE CHOLANGIOGRAM;  Surgeon: Imogene Burn. Georgette Dover, MD;  Location: [facility];  Service: General;  Laterality: N/A;  . DILATION AND CURETTAGE OF UTERUS      OB History    Gravida Para Term Preterm AB Living   4 2 2   2      SAB TAB Ectopic Multiple Live Births   2               Home Medications    Prior to Admission medications   Medication Sig Start Date End Date Taking? Authorizing  Provider  acetaminophen (TYLENOL) 500 MG tablet Take 1,500 mg by mouth every 6 (six) hours as needed for mild pain.   Yes Historical Provider, MD  aspirin EC 81 MG tablet Take 81 mg by mouth daily.   Yes Historical Provider, MD  famotidine (PEPCID) 20 MG tablet Take 1 tablet (20 mg total) by mouth 2 (two) times daily. 05/07/16  Yes Isla Pence, MD  gabapentin (NEURONTIN) 300 MG capsule Take 1 capsule (300 mg total) by mouth at bedtime. 05/07/16  Yes Isla Pence, MD  methylPREDNISolone (MEDROL DOSEPAK) 4 MG TBPK tablet TAKE PER package instructions FOR SIX DAYS 05/18/16  Yes Historical Provider, MD  oxyCODONE-acetaminophen (PERCOCET/ROXICET) 5-325 MG tablet Take 1 tablet by mouth every 4 (four) hours as needed for severe pain. 05/07/16  Yes Isla Pence, MD  Vitamin D, Ergocalciferol, (DRISDOL) 50000 units CAPS capsule Take 50,000 Units by mouth once a week. 05/11/16  Yes Historical Provider, MD  cephALEXin (KEFLEX) 500 MG capsule Take 1 capsule (500 mg total) by mouth 4 (four) times daily. 05/23/16 06/02/16  Dorie Rank, MD  cyclobenzaprine (FLEXERIL) 10 MG tablet Take 10 mg by mouth every 8 (eight) hours as needed for muscle spasms.  05/18/16   Historical Provider, MD  etodolac (LODINE) 300 MG capsule Take 1  capsule (300 mg total) by mouth every 8 (eight) hours. 05/23/16   Dorie Rank, MD  omeprazole (PRILOSEC) 40 MG capsule Take 1 capsule (40 mg total) by mouth daily. Patient not taking: Reported on 05/23/2016 12/01/15   Levin Erp, PA  ondansetron (ZOFRAN ODT) 4 MG disintegrating tablet Take 1 tablet (4 mg total) by mouth every 8 (eight) hours as needed for nausea or vomiting. 05/07/16   Isla Pence, MD    Family History Family History  Problem Relation Age of Onset  . Heart attack Mother   . Colon polyps Mother   . Clotting disorder Mother   . Diabetes Mother   . Heart disease Mother   . Cancer Father     colon  . Colon cancer Father   . Clotting disorder Father   . Diabetes Father     . Heart disease Father   . Clotting disorder Brother   . Heart disease Brother   . Diabetes Paternal Grandmother   . Heart attack Paternal Grandmother   . Hypertension Paternal Grandmother     Social History Social History  Substance Use Topics  . Smoking status: Former Smoker    Quit date: 06/11/2005  . Smokeless tobacco: Never Used  . Alcohol use No     Allergies   Hydrocodone   Review of Systems Review of Systems  All other systems reviewed and are negative.    Physical Exam Updated Vital Signs BP 159/98 (BP Location: Left Arm)   Pulse 89   Temp 97.8 F (36.6 C) (Oral)   Resp 18   Ht 5\' 1"  (1.549 m)   Wt 91.2 kg   LMP 05/13/2016 (Exact Date)   SpO2 98%   BMI 37.98 kg/m   Physical Exam  Constitutional: She appears well-developed and well-nourished. No distress.  HENT:  Head: Normocephalic and atraumatic.  Right Ear: External ear normal.  Left Ear: External ear normal.  Eyes: Conjunctivae are normal. Right eye exhibits no discharge. Left eye exhibits no discharge. No scleral icterus.  Neck: Neck supple. No tracheal deviation present.  Cardiovascular: Normal rate, regular rhythm and intact distal pulses.   Pulmonary/Chest: Effort normal and breath sounds normal. No stridor. No respiratory distress. She has no wheezes. She has no rales.  Abdominal: Soft. Bowel sounds are normal. She exhibits no distension. There is tenderness. There is no rebound and no guarding.  CVA tenderness on the left side  Musculoskeletal: She exhibits no edema or tenderness.  Neurological: She is alert. She has normal strength. No cranial nerve deficit (no facial droop, extraocular movements intact, no slurred speech) or sensory deficit. She exhibits normal muscle tone. She displays no seizure activity. Coordination normal.  Skin: Skin is warm and dry. No rash noted.  Psychiatric: She has a normal mood and affect.  Nursing note and vitals reviewed.    ED Treatments / Results   Labs (all labs ordered are listed, but only abnormal results are displayed) Labs Reviewed  BASIC METABOLIC PANEL - Abnormal; Notable for the following:       Result Value   Calcium 8.8 (*)    All other components within normal limits  CBC WITH DIFFERENTIAL/PLATELET - Abnormal; Notable for the following:    HCT 35.8 (*)    All other components within normal limits  URINALYSIS, ROUTINE W REFLEX MICROSCOPIC - Abnormal; Notable for the following:    APPearance CLOUDY (*)    Hgb urine dipstick MODERATE (*)    Leukocytes, UA LARGE (*)  Bacteria, UA FEW (*)    Squamous Epithelial / LPF 0-5 (*)    Non Squamous Epithelial 0-5 (*)    All other components within normal limits  URINE CULTURE  I-STAT BETA HCG BLOOD, ED ([facility], [facility], [facility] ONLY)    EKG  EKG Interpretation None       Radiology No results found.  Procedures Procedures (including critical care time)  Medications Ordered in ED Medications  fentaNYL (SUBLIMAZE) injection 50 mcg (50 mcg Intravenous Given 05/23/16 1813)  ondansetron (ZOFRAN) injection 4 mg (4 mg Intravenous Given 05/23/16 1813)  cefTRIAXone (ROCEPHIN) 1 g in dextrose 5 % 50 mL IVPB (0 g Intravenous Stopped 05/23/16 2041)  ketorolac (TORADOL) 15 MG/ML injection 15 mg (15 mg Intravenous Given 05/23/16 2051)     Initial Impression / Assessment and Plan / ED Course  I have reviewed the triage vital signs and the nursing notes.  Pertinent labs & imaging results that were available during my care of the patient were reviewed by me and considered in my medical decision making (see chart for details).  Clinical Course as of May 23 2253  Wed May 23, 2016  1713 Previous records reviewed.  Patient and a CT scan of abdomen and pelvis on January 1. No acute findings noted. No evidence of renal stone. No evidence of renal abscess.  [JK]    Clinical Course User Index [JK] Dorie Rank, MD    UA is consistent with UTI.  Pt has flank pain suggesting pyelo.  Vitals are  stable.   Urine culture sent.  Plan on discharge with oral abx.  Discussed importance of outpatient follow up   Final Clinical Impressions(s) / ED Diagnoses   Final diagnoses:  Acute cystitis without hematuria    New Prescriptions Discharge Medication List as of 05/23/2016  8:30 PM    START taking these medications   Details  cephALEXin (KEFLEX) 500 MG capsule Take 1 capsule (500 mg total) by mouth 4 (four) times daily., Starting Wed 05/23/2016, Until Sat 06/02/2016, Print    etodolac (LODINE) 300 MG capsule Take 1 capsule (300 mg total) by mouth every 8 (eight) hours., Starting Wed 05/23/2016, Print         Dorie Rank, MD 05/23/16 2256

## 2016-05-26 LAB — URINE CULTURE

## 2016-05-27 ENCOUNTER — Telehealth: Payer: Self-pay

## 2016-05-27 NOTE — Telephone Encounter (Signed)
Post ED Visit - Positive Culture Follow-up  Culture report reviewed by antimicrobial stewardship pharmacist:  []  Elenor Quinones, Pharm.D. []  Heide Guile, Pharm.D., BCPS []  Parks Neptune, Pharm.D. []  Alycia Rossetti, Pharm.D., BCPS []  [place], Pharm.D., BCPS, AAHIVP []  Legrand Como, Pharm.D., BCPS, AAHIVP []  Milus Glazier, Pharm.D. []  Stephens November, [place].D. Ebony Hail Masters Pharm D Positive urine culture Treated with Cephalexin, organism sensitive to the same and no further patient follow-up is required at this time.  Genia Del 05/27/2016, 9:02 AM

## 2016-06-21 ENCOUNTER — Emergency Department (HOSPITAL_COMMUNITY)
Admission: EM | Admit: 2016-06-21 | Discharge: 2016-06-21 | Disposition: A | Discharge status: Home / Self Care | Payer: BLUE CROSS/BLUE SHIELD | Attending: Emergency Medicine | Admitting: Emergency Medicine

## 2016-06-21 ENCOUNTER — Encounter (HOSPITAL_COMMUNITY): Payer: Self-pay | Admitting: Emergency Medicine

## 2016-06-21 ENCOUNTER — Emergency Department (HOSPITAL_COMMUNITY): Payer: BLUE CROSS/BLUE SHIELD

## 2016-06-21 DIAGNOSIS — Z7982 Long term (current) use of aspirin: Secondary | ICD-10-CM | POA: Diagnosis not present

## 2016-06-21 DIAGNOSIS — Z79899 Other long term (current) drug therapy: Secondary | ICD-10-CM | POA: Insufficient documentation

## 2016-06-21 DIAGNOSIS — I10 Essential (primary) hypertension: Secondary | ICD-10-CM | POA: Diagnosis not present

## 2016-06-21 DIAGNOSIS — R51 Headache: Secondary | ICD-10-CM | POA: Insufficient documentation

## 2016-06-21 DIAGNOSIS — Z87891 Personal history of nicotine dependence: Secondary | ICD-10-CM | POA: Diagnosis not present

## 2016-06-21 DIAGNOSIS — R519 Headache, unspecified: Secondary | ICD-10-CM

## 2016-06-21 LAB — COMPREHENSIVE METABOLIC PANEL
ALT: 14 U/L (ref 14–54)
AST: 19 U/L (ref 15–41)
Albumin: 4 g/dL (ref 3.5–5.0)
Alkaline Phosphatase: 70 U/L (ref 38–126)
Anion gap: 8 (ref 5–15)
BUN: 8 mg/dL (ref 6–20)
CHLORIDE: 108 mmol/L (ref 101–111)
CO2: 23 mmol/L (ref 22–32)
Calcium: 9.1 mg/dL (ref 8.9–10.3)
Creatinine, Ser: 0.7 mg/dL (ref 0.44–1.00)
Glucose, Bld: 134 mg/dL — ABNORMAL HIGH (ref 65–99)
POTASSIUM: 3.8 mmol/L (ref 3.5–5.1)
Sodium: 139 mmol/L (ref 135–145)
Total Bilirubin: 0.8 mg/dL (ref 0.3–1.2)
Total Protein: 7 g/dL (ref 6.5–8.1)

## 2016-06-21 LAB — CBC
HCT: 36.7 % (ref 36.0–46.0)
Hemoglobin: 12.1 g/dL (ref 12.0–15.0)
MCH: 26.6 pg (ref 26.0–34.0)
MCHC: 33 g/dL (ref 30.0–36.0)
MCV: 80.7 fL (ref 78.0–100.0)
PLATELETS: 345 10*3/uL (ref 150–400)
RBC: 4.55 MIL/uL (ref 3.87–5.11)
RDW: 14.1 % (ref 11.5–15.5)
WBC: 7.2 10*3/uL (ref 4.0–10.5)

## 2016-06-21 LAB — LIPASE, BLOOD: LIPASE: 25 U/L (ref 11–51)

## 2016-06-21 MED ORDER — KETOROLAC TROMETHAMINE 30 MG/ML IJ SOLN
15.0000 mg | Freq: Once | INTRAMUSCULAR | Status: AC
  Administered 2016-06-21: 15 mg via INTRAVENOUS
  Filled 2016-06-21: qty 1

## 2016-06-21 MED ORDER — METOCLOPRAMIDE HCL 5 MG/ML IJ SOLN
10.0000 mg | Freq: Once | INTRAMUSCULAR | Status: AC
  Administered 2016-06-21: 10 mg via INTRAVENOUS
  Filled 2016-06-21: qty 2

## 2016-06-21 MED ORDER — DIPHENHYDRAMINE HCL 50 MG/ML IJ SOLN
25.0000 mg | Freq: Once | INTRAMUSCULAR | Status: AC
  Administered 2016-06-21: 25 mg via INTRAVENOUS
  Filled 2016-06-21: qty 1

## 2016-06-21 MED ORDER — SODIUM CHLORIDE 0.9 % IV BOLUS (SEPSIS)
1000.0000 mL | Freq: Once | INTRAVENOUS | Status: AC
  Administered 2016-06-21: 1000 mL via INTRAVENOUS

## 2016-06-21 NOTE — ED Triage Notes (Signed)
Pt from home with c/o headache, dizziness, and nausea starting last night with a "sick" feeling.  Pt states she has a hx of migraines but that this feels different.  NAD, A&O.

## 2016-06-21 NOTE — ED Notes (Signed)
Pt A&Ox4, ambulatory at d/c with steady gait, NAD. Pt declined wheelchair.

## 2016-06-21 NOTE — ED Provider Notes (Signed)
[facility] DEPT Provider Note   CSN: [id number] Arrival date & time: 06/21/16  [id number]     History   Chief Complaint Chief Complaint  Patient presents with  . Nausea  . Dizziness  . Headache    HPI Alexis Miranda is a 46 y.o. female.  HPI  46 year old female presents for evaluation and treatment of a severe right-sided headache. Started last night. She was sitting on the couch when it started. She frequently gets headaches and has multiple headaches per day. She has been diagnosed with migraines. This is an occipital headache, most prominent on the right occiput. Started off gradual and has progressively worsened but never gone away. She tried ibuprofen and Tylenol without relief. She has also had bilateral mild blurry vision since onset. Also right arm and leg weakness and numbness. These have all happened before with headaches but this one feels different and more severe than typical. The blurry vision and numbness all happen every single time she gets a headache. There has been no neck pain or stiffness. No fevers. Has been nauseated without vomiting. Has had photophobia. She feels like here headaches are coming on more frequently recently. Patient also developed chest pain briefly in waiting room, now resolved without treatment. Typically gets this at same time as her headaches  Past Medical History:  Diagnosis Date  . Anemia   . Chronic headaches   . Colon polyps   . DVT (deep venous thrombosis) ([facility])   . Gallstones   . GERD (gastroesophageal reflux disease)   . Hypertension    no meds  . IBS (irritable bowel syndrome)   . Pneumonia   . Seizures ([place])   . Uterine cyst     Patient Active Problem List   Diagnosis Date Noted  . Chronic cholecystitis with calculus 06/11/2012    Past Surgical History:  Procedure Laterality Date  . CESAREAN SECTION  06/07/02 03/07/06   2x  . CHOLECYSTECTOMY  06/12/2012   Procedure: LAPAROSCOPIC CHOLECYSTECTOMY WITH INTRAOPERATIVE  CHOLANGIOGRAM;  Surgeon: Imogene Burn. Georgette Dover, MD;  Location: [facility];  Service: General;  Laterality: N/A;  . DILATION AND CURETTAGE OF UTERUS      OB History    Gravida Para Term Preterm AB Living   4 2 2   2      SAB TAB Ectopic Multiple Live Births   2               Home Medications    Prior to Admission medications   Medication Sig Start Date End Date Taking? Authorizing Provider  acetaminophen (TYLENOL) 500 MG tablet Take 1,500 mg by mouth every 6 (six) hours as needed for mild pain.   Yes Historical Provider, MD  aspirin EC 81 MG tablet Take 81 mg by mouth daily.   Yes Historical Provider, MD  cyclobenzaprine (FLEXERIL) 10 MG tablet Take 10 mg by mouth every 8 (eight) hours as needed for muscle spasms.  05/18/16   Historical Provider, MD  etodolac (LODINE) 300 MG capsule Take 1 capsule (300 mg total) by mouth every 8 (eight) hours. Patient not taking: Reported on 06/21/2016 05/23/16   Dorie Rank, MD  famotidine (PEPCID) 20 MG tablet Take 1 tablet (20 mg total) by mouth 2 (two) times daily. Patient not taking: Reported on 06/21/2016 05/07/16   Isla Pence, MD  gabapentin (NEURONTIN) 300 MG capsule Take 1 capsule (300 mg total) by mouth at bedtime. Patient not taking: Reported on 06/21/2016 05/07/16   Isla Pence, MD  methylPREDNISolone (MEDROL DOSEPAK) 4 MG TBPK tablet TAKE PER package instructions FOR SIX DAYS 05/18/16   Historical Provider, MD  omeprazole (PRILOSEC) 40 MG capsule Take 1 capsule (40 mg total) by mouth daily. Patient not taking: Reported on 05/23/2016 12/01/15   Levin Erp, PA  ondansetron (ZOFRAN ODT) 4 MG disintegrating tablet Take 1 tablet (4 mg total) by mouth every 8 (eight) hours as needed for nausea or vomiting. Patient not taking: Reported on 06/21/2016 05/07/16   Isla Pence, MD  oxyCODONE-acetaminophen (PERCOCET/ROXICET) 5-325 MG tablet Take 1 tablet by mouth every 4 (four) hours as needed for severe pain. Patient not taking: Reported  on 06/21/2016 05/07/16   Isla Pence, MD  Vitamin D, Ergocalciferol, (DRISDOL) 50000 units CAPS capsule Take 50,000 Units by mouth once a week. 05/11/16   Historical Provider, MD    Family History Family History  Problem Relation Age of Onset  . Heart attack Mother   . Colon polyps Mother   . Clotting disorder Mother   . Diabetes Mother   . Heart disease Mother   . Cancer Father     colon  . Colon cancer Father   . Clotting disorder Father   . Diabetes Father   . Heart disease Father   . Clotting disorder Brother   . Heart disease Brother   . Diabetes Paternal Grandmother   . Heart attack Paternal Grandmother   . Hypertension Paternal Grandmother     Social History Social History  Substance Use Topics  . Smoking status: Former Smoker    Quit date: 06/11/2005  . Smokeless tobacco: Never Used  . Alcohol use No     Allergies   Hydrocodone   Review of Systems Review of Systems  Constitutional: Negative for fever.  Cardiovascular: Positive for chest pain.  Gastrointestinal: Positive for nausea. Negative for vomiting.  Musculoskeletal: Negative for neck pain and neck stiffness.  Neurological: Positive for dizziness, weakness, numbness and headaches.  All other systems reviewed and are negative.    Physical Exam Updated Vital Signs BP 158/99 (BP Location: Left Arm)   Pulse 89   Temp 98.5 F (36.9 C) (Oral)   Resp 17   LMP 06/07/2016 (Approximate)   SpO2 100%   Physical Exam  Constitutional: She is oriented to person, place, and time. She appears well-developed and well-nourished.  HENT:  Head: Normocephalic and atraumatic.  Right Ear: External ear normal.  Left Ear: External ear normal.  Nose: Nose normal.  Eyes: EOM are normal. Pupils are equal, round, and reactive to light. Right eye exhibits no discharge. Left eye exhibits no discharge.  photophobia  Neck: Normal range of motion. Neck supple.  No meningismus  Cardiovascular: Normal rate, regular rhythm  and normal heart sounds.   HR~100  Pulmonary/Chest: Effort normal and breath sounds normal.  Abdominal: Soft. There is no tenderness.  Neurological: She is alert and oriented to person, place, and time.  CN 3-12 grossly intact. 5/5 strength in all 4 extremities but slight weakness but still 5/5 in RUE, RLE. Grossly normal sensation in LUE, LLE. Decreased sensation in RUE, RLE, right face. Normal finger to nose.   Skin: Skin is warm and dry.  Nursing note and vitals reviewed.    ED Treatments / Results  Labs (all labs ordered are listed, but only abnormal results are displayed) Labs Reviewed  COMPREHENSIVE METABOLIC PANEL - Abnormal; Notable for the following:       Result Value   Glucose, Bld 134 (*)  All other components within normal limits  LIPASE, BLOOD  CBC    EKG  EKG Interpretation  Date/Time:  Thursday June 21 2016 10:50:49 EST Ventricular Rate:  93 PR Interval:    QRS Duration: 96 QT Interval:  368 QTC Calculation: 458 R Axis:   24 Text Interpretation:  Sinus rhythm no acute ST/T changes No old tracing to compare Confirmed by Macee Venables MD, [place] [phone]) on 06/21/2016 11:59:27 AM       Radiology Ct Head Wo Contrast  Result Date: 06/21/2016 CLINICAL DATA:  46 year old female with history of right-sided parieto-occipital headache, dizziness and nausea. Injury to the head on a door frame because of dizziness. History of migraines. EXAM: CT HEAD WITHOUT CONTRAST TECHNIQUE: Contiguous axial images were obtained from the base of the skull through the vertex without intravenous contrast. COMPARISON:  No priors. FINDINGS: Brain: In the left temporal lobe (axial image 9 of series 3 and coronal image 39 of series 5) there is a hyperdense lesion measuring 1.2 cm in diameter which is clearly separate from the dura, concerning for either a primary CNS lesion (such as a cavernoma) or less likely a metastatic lesion. This does not appear to represent a focal hemorrhage. No  evidence of acute infarction, hydrocephalus, extra-axial collection or significant mass effect. Vascular: No hyperdense vessel or unexpected calcification. Skull: Normal. Negative for fracture or focal lesion. Sinuses/Orbits: No acute finding. Other: None. IMPRESSION: 1. 1.2 cm hyperdense lesion in the left temporal lobe which is indeterminate on CT imaging, but demonstrates no surrounding edema are significant mass effect, likely to reflect a primary CNS lesion such as a cavernoma. Further evaluation with MRI of the brain with and without IV gadolinium is recommended at this time. These results were called by telephone at the time of interpretation on 06/21/2016 at 12:55 pm to Dr. Sherwood Gambler, who verbally acknowledged these results. Electronically Signed   By: Vinnie Langton M.D.   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  Pertinent labs & imaging results that were available during my care of the patient were reviewed by me and considered in my medical decision making (see chart for details).  Clinical Course as of Jun 21 1801  Thu Jun 21, 2016  1209 Likely a more severe headache but still a migraine. HA meds, fluids, CT  [SG]  1303 Discussed need for MRI, she declines because she has had this finding before and does not want  MRI at this time. Stressed need for oupatient f/u  [SG]    Clinical Course User Index [SG] Sherwood Gambler, MD    HA has improved with treatment. I suspect this is related to acute on chronic headaches/migraines. Her neuro exam is inconsistent. She reports improvement with HA medicine. Declines MRI. Care Everywhere shows she has a cavernoma seen on MRI 3 years ago. Offered MRI for better definition  but she declines. D/c with return precautions  Final Clinical Impressions(s) / ED Diagnoses   Final diagnoses:  Occipital headache    New Prescriptions Discharge Medication List as of 06/21/2016  1:06 PM       Sherwood Gambler, MD 06/21/16 [id number]

## 2016-06-21 NOTE — ED Notes (Signed)
Patient transported to CT 

## 2016-06-21 NOTE — Discharge Instructions (Signed)
Your CT scan shows a small spot in the left side of your brain concerning for a possible cavernoma. However it could be other things. It is important to get an MRI as an outpatient. Return if any of your symptoms worsen or change.

## 2016-06-26 ENCOUNTER — Emergency Department (HOSPITAL_COMMUNITY)
Admission: EM | Admit: 2016-06-26 | Discharge: 2016-06-26 | Discharge status: Against Medical Advice | Payer: BLUE CROSS/BLUE SHIELD

## 2016-06-26 ENCOUNTER — Encounter: Payer: Self-pay | Admitting: *Deleted

## 2016-06-26 ENCOUNTER — Emergency Department
Admission: EM | Admit: 2016-06-26 | Discharge: 2016-06-26 | Disposition: A | Discharge status: Home / Self Care | Payer: BLUE CROSS/BLUE SHIELD | Attending: Emergency Medicine | Admitting: Emergency Medicine

## 2016-06-26 DIAGNOSIS — R1011 Right upper quadrant pain: Secondary | ICD-10-CM | POA: Insufficient documentation

## 2016-06-26 DIAGNOSIS — Z5321 Procedure and treatment not carried out due to patient leaving prior to being seen by health care provider: Secondary | ICD-10-CM | POA: Diagnosis not present

## 2016-06-26 DIAGNOSIS — Z87891 Personal history of nicotine dependence: Secondary | ICD-10-CM | POA: Diagnosis not present

## 2016-06-26 DIAGNOSIS — I10 Essential (primary) hypertension: Secondary | ICD-10-CM | POA: Diagnosis not present

## 2016-06-26 DIAGNOSIS — Z7983 Long term (current) use of bisphosphonates: Secondary | ICD-10-CM | POA: Diagnosis not present

## 2016-06-26 DIAGNOSIS — Z79899 Other long term (current) drug therapy: Secondary | ICD-10-CM | POA: Insufficient documentation

## 2016-06-26 LAB — URINALYSIS, COMPLETE (UACMP) WITH MICROSCOPIC
Bilirubin Urine: NEGATIVE
Glucose, UA: NEGATIVE mg/dL
Hgb urine dipstick: NEGATIVE
KETONES UR: NEGATIVE mg/dL
Leukocytes, UA: NEGATIVE
Nitrite: NEGATIVE
PH: 5 (ref 5.0–8.0)
Protein, ur: NEGATIVE mg/dL
SPECIFIC GRAVITY, URINE: 1.028 (ref 1.005–1.030)

## 2016-06-26 LAB — CBC
HCT: 34.4 % — ABNORMAL LOW (ref 35.0–47.0)
Hemoglobin: 12.1 g/dL (ref 12.0–16.0)
MCH: 28.3 pg (ref 26.0–34.0)
MCHC: 35.3 g/dL (ref 32.0–36.0)
MCV: 80.2 fL (ref 80.0–100.0)
PLATELETS: 353 10*3/uL (ref 150–440)
RBC: 4.29 MIL/uL (ref 3.80–5.20)
RDW: 15 % — AB (ref 11.5–14.5)
WBC: 9.3 10*3/uL (ref 3.6–11.0)

## 2016-06-26 LAB — COMPREHENSIVE METABOLIC PANEL
ALT: 12 U/L — AB (ref 14–54)
AST: 17 U/L (ref 15–41)
Albumin: 4.2 g/dL (ref 3.5–5.0)
Alkaline Phosphatase: 69 U/L (ref 38–126)
Anion gap: 5 (ref 5–15)
BUN: 19 mg/dL (ref 6–20)
CHLORIDE: 110 mmol/L (ref 101–111)
CO2: 23 mmol/L (ref 22–32)
CREATININE: 0.65 mg/dL (ref 0.44–1.00)
Calcium: 8.9 mg/dL (ref 8.9–10.3)
GFR calc Af Amer: >60 mL/min (ref >60)
GFR calc non Af Amer: >60 mL/min (ref >60)
GLUCOSE: 117 mg/dL — AB (ref 65–99)
Potassium: 3.9 mmol/L (ref 3.5–5.1)
SODIUM: 138 mmol/L (ref 135–145)
Total Bilirubin: 0.4 mg/dL (ref 0.3–1.2)
Total Protein: 7.4 g/dL (ref 6.5–8.1)

## 2016-06-26 LAB — LIPASE, BLOOD: LIPASE: 26 U/L (ref 11–51)

## 2016-06-26 NOTE — ED Notes (Signed)
Patient states that they are leaving due to their child not allowed out of lobby due to hospital restrictions.

## 2016-06-26 NOTE — ED Triage Notes (Signed)
Pt presents to ED from home reporting  RUQ pain beginning Saturday. PT reports pain with palpation and fevers at home. Pt denies changes in urination but reports NVD since Saturday as well. Decreased appetite reported with increased pain after eating. Pt reports last does of tylenol was at 6:30 this morning.

## 2016-07-09 ENCOUNTER — Encounter (HOSPITAL_COMMUNITY): Payer: Self-pay | Admitting: Emergency Medicine

## 2016-07-09 ENCOUNTER — Emergency Department (HOSPITAL_COMMUNITY)
Admission: EM | Admit: 2016-07-09 | Discharge: 2016-07-09 | Disposition: A | Discharge status: Home / Self Care | Payer: BLUE CROSS/BLUE SHIELD | Attending: Emergency Medicine | Admitting: Emergency Medicine

## 2016-07-09 DIAGNOSIS — Z87891 Personal history of nicotine dependence: Secondary | ICD-10-CM | POA: Insufficient documentation

## 2016-07-09 DIAGNOSIS — Z7982 Long term (current) use of aspirin: Secondary | ICD-10-CM | POA: Insufficient documentation

## 2016-07-09 DIAGNOSIS — I10 Essential (primary) hypertension: Secondary | ICD-10-CM | POA: Insufficient documentation

## 2016-07-09 DIAGNOSIS — Z79899 Other long term (current) drug therapy: Secondary | ICD-10-CM | POA: Insufficient documentation

## 2016-07-09 DIAGNOSIS — L03032 Cellulitis of left toe: Secondary | ICD-10-CM

## 2016-07-09 DIAGNOSIS — L03116 Cellulitis of left lower limb: Secondary | ICD-10-CM | POA: Insufficient documentation

## 2016-07-09 MED ORDER — TRAMADOL HCL 50 MG PO TABS: 50.0000 mg | ORAL_TABLET | Freq: Four times a day (QID) | ORAL | 0 refills | Status: AC | PRN

## 2016-07-09 MED ORDER — POVIDONE-IODINE 10 % EX SOLN
CUTANEOUS | Status: AC
  Administered 2016-07-09: 10:00:00
  Filled 2016-07-09: qty 118

## 2016-07-09 MED ORDER — LIDOCAINE HCL (PF) 2 % IJ SOLN
INTRAMUSCULAR | Status: AC
  Administered 2016-07-09: 10 mL
  Filled 2016-07-09: qty 10

## 2016-07-09 MED ORDER — SULFAMETHOXAZOLE-TRIMETHOPRIM 800-160 MG PO TABS: 1.0000 | ORAL_TABLET | Freq: Two times a day (BID) | ORAL | 0 refills | Status: AC

## 2016-07-09 NOTE — Discharge Instructions (Signed)
Warm water soaks 2-3 times a day.  Keep the toe bandaged for 3-4 days.  Follow-up with the podiatrist listed if needed.

## 2016-07-09 NOTE — ED Provider Notes (Signed)
[facility] DEPT Provider Note   CSN: [id number] Arrival date & time: 07/09/16  [id number]     History   Chief Complaint Chief Complaint  Patient presents with  . Toe Pain    HPI Alexis Miranda is a 46 y.o. female.  HPI   Alexis Miranda is a 46 y.o. female who presents to the Emergency Department complaining of pain and drainage from the left great toenail. Symptoms began several days ago after she "picked" at a piece of skin near the edge of the nail.  She reports redness to the lateral and base of the nail.  Pain associated with weight bearing.  She also notes yellow drainage intermittently.  She denies ingrown nail, red streaks, numbness or pain proximal to the distal toe.    Past Medical History:  Diagnosis Date  . Anemia   . Chronic headaches   . Colon polyps   . DVT (deep venous thrombosis) ([place])   . Gallstones   . GERD (gastroesophageal reflux disease)   . Hypertension    no meds  . IBS (irritable bowel syndrome)   . Pneumonia   . Seizures ([place])   . Uterine cyst     Patient Active Problem List   Diagnosis Date Noted  . Chronic cholecystitis with calculus 06/11/2012    Past Surgical History:  Procedure Laterality Date  . CESAREAN SECTION  06/07/02 03/07/06   2x  . CHOLECYSTECTOMY  06/12/2012   Procedure: LAPAROSCOPIC CHOLECYSTECTOMY WITH INTRAOPERATIVE CHOLANGIOGRAM;  Surgeon: Imogene Burn. Georgette Dover, MD;  Location: [facility];  Service: General;  Laterality: N/A;  . DILATION AND CURETTAGE OF UTERUS      OB History    Gravida Para Term Preterm AB Living   4 2 2   2      SAB TAB Ectopic Multiple Live Births   2               Home Medications    Prior to Admission medications   Medication Sig Start Date End Date Taking? Authorizing Provider  acetaminophen (TYLENOL) 500 MG tablet Take 1,500 mg by mouth every 6 (six) hours as needed for mild pain.    Historical Provider, MD  aspirin EC 81 MG tablet Take 81 mg by mouth daily.    Historical Provider,  MD  cyclobenzaprine (FLEXERIL) 10 MG tablet Take 10 mg by mouth every 8 (eight) hours as needed for muscle spasms.  05/18/16   Historical Provider, MD  etodolac (LODINE) 300 MG capsule Take 1 capsule (300 mg total) by mouth every 8 (eight) hours. Patient not taking: Reported on 06/21/2016 05/23/16   Dorie Rank, MD  famotidine (PEPCID) 20 MG tablet Take 1 tablet (20 mg total) by mouth 2 (two) times daily. Patient not taking: Reported on 06/21/2016 05/07/16   Isla Pence, MD  gabapentin (NEURONTIN) 300 MG capsule Take 1 capsule (300 mg total) by mouth at bedtime. Patient not taking: Reported on 06/21/2016 05/07/16   Isla Pence, MD  methylPREDNISolone (MEDROL DOSEPAK) 4 MG TBPK tablet TAKE PER package instructions FOR SIX DAYS 05/18/16   Historical Provider, MD  omeprazole (PRILOSEC) 40 MG capsule Take 1 capsule (40 mg total) by mouth daily. Patient not taking: Reported on 05/23/2016 12/01/15   Levin Erp, PA  ondansetron (ZOFRAN ODT) 4 MG disintegrating tablet Take 1 tablet (4 mg total) by mouth every 8 (eight) hours as needed for nausea or vomiting. Patient not taking: Reported on 06/21/2016 05/07/16   Isla Pence, MD  oxyCODONE-acetaminophen (PERCOCET/ROXICET) 5-325 MG tablet Take 1 tablet by mouth every 4 (four) hours as needed for severe pain. Patient not taking: Reported on 06/21/2016 05/07/16   Isla Pence, MD  Vitamin D, Ergocalciferol, (DRISDOL) 50000 units CAPS capsule Take 50,000 Units by mouth once a week. 05/11/16   Historical Provider, MD    Family History Family History  Problem Relation Age of Onset  . Heart attack Mother   . Colon polyps Mother   . Clotting disorder Mother   . Diabetes Mother   . Heart disease Mother   . Cancer Father     colon  . Colon cancer Father   . Clotting disorder Father   . Diabetes Father   . Heart disease Father   . Clotting disorder Brother   . Heart disease Brother   . Diabetes Paternal Grandmother   . Heart attack Paternal Grandmother    . Hypertension Paternal Grandmother     Social History Social History  Substance Use Topics  . Smoking status: Former Smoker    Quit date: 06/11/2005  . Smokeless tobacco: Never Used  . Alcohol use No     Allergies   Hydrocodone   Review of Systems Review of Systems  Constitutional: Negative for chills and fever.  Musculoskeletal: Positive for arthralgias. Negative for joint swelling.  Skin: Positive for wound. Negative for color change.       Left great toe  Neurological: Negative for weakness and numbness.  All other systems reviewed and are negative.    Physical Exam Updated Vital Signs BP 159/91 (BP Location: Right Arm)   Pulse 84   Temp 97.5 F (36.4 C) (Oral)   Resp 18   Ht 5\' 1"  (1.549 m)   Wt 90.7 kg   LMP 07/03/2016   SpO2 100%   BMI 37.79 kg/m   Physical Exam  Constitutional: She is oriented to person, place, and time. She appears well-developed and well-nourished. No distress.  HENT:  Head: Atraumatic.  Cardiovascular: Normal rate, regular rhythm and intact distal pulses.   Pulmonary/Chest: Effort normal and breath sounds normal.  Musculoskeletal: Normal range of motion. She exhibits tenderness. She exhibits no edema.       Left foot: There is tenderness. There is no bony tenderness and no swelling.       Feet:  Localized erythema and avulsed skin of the lateral left great toe.  granulation tissue present of the lateral nail.  No paronychia. No lymphangitis.   Neurological: She is alert and oriented to person, place, and time.  Skin: Skin is warm. Capillary refill takes less than 2 seconds.  Nursing note and vitals reviewed.    ED Treatments / Results  Labs (all labs ordered are listed, but only abnormal results are displayed) Labs Reviewed - No data to display  EKG  EKG Interpretation None       Radiology No results found.  Procedures .Nail Removal Date/Time: 07/09/2016 9:52 AM Performed by: Kem Parkinson Authorized by:  Kem Parkinson   Consent:    Consent obtained:  Verbal   Consent given by:  Patient   Risks discussed:  Bleeding, infection and incomplete removal   Alternatives discussed:  Referral Location:    Foot:  L big toe Pre-procedure details:    Skin preparation:  Betadine Anesthesia (see MAR for exact dosages):    Anesthesia method:  Nerve block   Block location:  Left great toe   Block needle gauge:  25 G   Block anesthetic:  Lidocaine 2% w/o epi   Block technique:  Ring block   Block injection procedure:  Anatomic landmarks identified and negative aspiration for blood   Block outcome:  Anesthesia achieved Nail Removal:    Nail removal amount: nail edge trimmed.   Nail bed repaired: no     Removed nail replaced and anchored: no   Trephination:    Subungual hematoma drained: no   Nails trimmed:    Number of nails trimmed:  1 Post-procedure details:    Dressing:  4x4 sterile gauze   Patient tolerance of procedure:  Tolerated well, no immediate complications   (including critical care time)  Medications Ordered in ED Medications - No data to display   Initial Impression / Assessment and Plan / ED Course  I have reviewed the triage vital signs and the nursing notes.  Pertinent labs & imaging results that were available during my care of the patient were reviewed by me and considered in my medical decision making (see chart for details).     Pt with localized cellulitis of the toe, likely due to trauma . No significant ingrown nail, but edge was trimmed. NV intact. Agrees to elevate, warm soaks, podiatry f/u   Final Clinical Impressions(s) / ED Diagnoses   Final diagnoses:  Cellulitis of toe of left foot    New Prescriptions New Prescriptions   No medications on file     Bufford Lope 07/09/16 1001    Julianne Rice, MD 07/10/16 2100

## 2016-07-09 NOTE — ED Triage Notes (Signed)
Pt reports pain and drainage from left great toenail.

## 2016-09-02 ENCOUNTER — Emergency Department (HOSPITAL_BASED_OUTPATIENT_CLINIC_OR_DEPARTMENT_OTHER)
Admit: 2016-09-02 | Discharge: 2016-09-02 | Disposition: A | Discharge status: Home / Self Care | Payer: Self-pay | Attending: Emergency Medicine | Admitting: Emergency Medicine

## 2016-09-02 ENCOUNTER — Encounter (HOSPITAL_COMMUNITY): Payer: Self-pay

## 2016-09-02 ENCOUNTER — Emergency Department (HOSPITAL_COMMUNITY)
Admission: EM | Admit: 2016-09-02 | Discharge: 2016-09-02 | Disposition: A | Discharge status: Home / Self Care | Payer: Self-pay | Attending: Emergency Medicine | Admitting: Emergency Medicine

## 2016-09-02 ENCOUNTER — Emergency Department (HOSPITAL_COMMUNITY): Payer: Self-pay

## 2016-09-02 DIAGNOSIS — Z79899 Other long term (current) drug therapy: Secondary | ICD-10-CM | POA: Insufficient documentation

## 2016-09-02 DIAGNOSIS — M5441 Lumbago with sciatica, right side: Secondary | ICD-10-CM | POA: Insufficient documentation

## 2016-09-02 DIAGNOSIS — Z87891 Personal history of nicotine dependence: Secondary | ICD-10-CM | POA: Insufficient documentation

## 2016-09-02 DIAGNOSIS — M79609 Pain in unspecified limb: Secondary | ICD-10-CM

## 2016-09-02 DIAGNOSIS — G8929 Other chronic pain: Secondary | ICD-10-CM

## 2016-09-02 DIAGNOSIS — I10 Essential (primary) hypertension: Secondary | ICD-10-CM | POA: Insufficient documentation

## 2016-09-02 LAB — D-DIMER, QUANTITATIVE: D-Dimer, Quant: 0.46 ug/mL-FEU (ref 0.00–0.50)

## 2016-09-02 LAB — PREGNANCY, URINE: PREG TEST UR: NEGATIVE

## 2016-09-02 MED ORDER — OXYCODONE-ACETAMINOPHEN 5-325 MG PO TABS
1.0000 | ORAL_TABLET | Freq: Once | ORAL | Status: AC
  Administered 2016-09-02: 1 via ORAL
  Filled 2016-09-02: qty 1

## 2016-09-02 MED ORDER — PREDNISONE 10 MG PO TABS: 40.0000 mg | ORAL_TABLET | Freq: Every day | ORAL | 0 refills | Status: AC

## 2016-09-02 MED ORDER — ONDANSETRON 4 MG PO TBDP
4.0000 mg | ORAL_TABLET | Freq: Once | ORAL | Status: AC
  Administered 2016-09-02: 4 mg via ORAL
  Filled 2016-09-02: qty 1

## 2016-09-02 MED ORDER — IBUPROFEN 800 MG PO TABS: 800.0000 mg | ORAL_TABLET | Freq: Once | ORAL | Status: DC

## 2016-09-02 MED ORDER — IBUPROFEN 800 MG PO TABS
800.0000 mg | ORAL_TABLET | Freq: Once | ORAL | Status: AC
  Administered 2016-09-02: 800 mg via ORAL
  Filled 2016-09-02: qty 1

## 2016-09-02 NOTE — ED Notes (Signed)
Pt husband states he has downloaded the records from pt's admission at [facility] last month.

## 2016-09-02 NOTE — ED Notes (Signed)
Pain unchanged, nausea improved.

## 2016-09-02 NOTE — ED Triage Notes (Signed)
Patient complains of lower back pain with radiation down right leg x 1 week. Taking aleve and tylenol with minimal relief. denies injury. 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

## 2016-09-02 NOTE — ED Notes (Signed)
Asking for something for pain.

## 2016-09-02 NOTE — ED Notes (Signed)
Spoke with vascular lab to confirm receipt of order.

## 2016-09-02 NOTE — Progress Notes (Signed)
VASCULAR LAB PRELIMINARY  PRELIMINARY  PRELIMINARY  PRELIMINARY  Right lower extremity venous duplex completed.    Preliminary report:  There is no DVT or SVT noted in the right lower extremity.  Called report to Avie Echevaria, PA-C  [facility], [facility], RVT 09/02/2016, 2:15 PM

## 2016-09-02 NOTE — Discharge Instructions (Signed)
As discussed, take prednisone for the next five days and call your primary care provider for follow up. You may also use ice.  Return to the emergency department if you experience worsening, numbness, loss of bowel or bladder function, fever, chills, night sweats or any new concerning symptoms.

## 2016-09-02 NOTE — ED Notes (Signed)
Patient transported to Ultrasound 

## 2016-09-02 NOTE — ED Notes (Signed)
Returned from xray

## 2016-09-02 NOTE — ED Notes (Signed)
Patient transported to X-ray 

## 2016-09-02 NOTE — ED Provider Notes (Signed)
[facility] DEPT Provider Note   CSN: [id number] Arrival date & time: 09/02/16  0915  By signing my name below, I, Dora Sims, attest that this documentation has been prepared under the direction and in the presence of Avie Echevaria, PA-C. Electronically Signed: Dora Sims, Scribe. 09/02/2016. 10:33 AM.  History   Chief Complaint Chief Complaint  Patient presents with  . Back Pain   The history is provided by the patient. No language interpreter was used.    HPI Comments: SENIAH LAWRENCE is a 46 y.o. female with PMHx including HTN who presents to the Emergency Department complaining of sudden onset, constant, right lower back pain radiating down the right lower extremity beginning about 10 days ago. She states she was initially awoken from sleep with her pain and has no h/o the same. Patient notes the pain radiates all the way into her right foot and endorses some tingling in her right lower leg as well. She states her pain is worse with ambulating and certain positions. She has tried an OTC pain relief cream as well as Tylenol and Aleve without improvement of her pain. No ice or heat therapy tried. Patient has a h/o DVT in her RLE "at least 20 years ago". She is supposed to take a baby aspirin daily for her h/o seizures but does not take it as instructed. No recent surgeries or immobilizations. No h/o cancer, DM, CAD, or HLD. No hormone therapy. No h/o IVDU. She is a regular smoker. She has a listed allergy to hydrocodone but reports taking vicodin and percocet in the past without problems. Pt denies leg swelling, cough, hemoptysis, SOB, fevers, chills, urinary/bowel incontinence, saddle anesthesia, night sweats, or any other associated symptoms.  PCP: Dr. Edrick Oh  Past Medical History:  Diagnosis Date  . Anemia   . Chronic headaches   . Colon polyps   . DVT (deep venous thrombosis) ([place])   . Gallstones   . GERD (gastroesophageal reflux disease)   . Hypertension    no meds    . IBS (irritable bowel syndrome)   . Pneumonia   . Seizures ([place])   . Uterine cyst     Patient Active Problem List   Diagnosis Date Noted  . Chronic cholecystitis with calculus 06/11/2012    Past Surgical History:  Procedure Laterality Date  . CESAREAN SECTION  06/07/02 03/07/06   2x  . CHOLECYSTECTOMY  06/12/2012   Procedure: LAPAROSCOPIC CHOLECYSTECTOMY WITH INTRAOPERATIVE CHOLANGIOGRAM;  Surgeon: Imogene Burn. Georgette Dover, MD;  Location: [facility];  Service: General;  Laterality: N/A;  . DILATION AND CURETTAGE OF UTERUS      OB History    Gravida Para Term Preterm AB Living   4 2 2   2      SAB TAB Ectopic Multiple Live Births   2               Home Medications    Prior to Admission medications   Medication Sig Start Date End Date Taking? Authorizing Provider  acetaminophen (TYLENOL) 500 MG tablet Take 1,500 mg by mouth every 6 (six) hours as needed for mild pain.   Yes Historical Provider, MD  etodolac (LODINE) 300 MG capsule Take 1 capsule (300 mg total) by mouth every 8 (eight) hours. Patient not taking: Reported on 06/21/2016 05/23/16   Dorie Rank, MD  famotidine (PEPCID) 20 MG tablet Take 1 tablet (20 mg total) by mouth 2 (two) times daily. Patient taking differently: Take 20 mg by mouth daily.  05/07/16   Isla Pence, MD  gabapentin (NEURONTIN) 300 MG capsule Take 1 capsule (300 mg total) by mouth at bedtime. Patient not taking: Reported on 09/02/2016 05/07/16   Isla Pence, MD  omeprazole (PRILOSEC) 40 MG capsule Take 1 capsule (40 mg total) by mouth daily. Patient not taking: Reported on 09/02/2016 12/01/15   Levin Erp, PA  ondansetron (ZOFRAN ODT) 4 MG disintegrating tablet Take 1 tablet (4 mg total) by mouth every 8 (eight) hours as needed for nausea or vomiting. Patient not taking: Reported on 06/21/2016 05/07/16   Isla Pence, MD  oxyCODONE-acetaminophen (PERCOCET/ROXICET) 5-325 MG tablet Take 1 tablet by mouth every 4 (four) hours as needed for  severe pain. Patient not taking: Reported on 06/21/2016 05/07/16   Isla Pence, MD  predniSONE (DELTASONE) 10 MG tablet Take 4 tablets (40 mg total) by mouth daily. 09/02/16 09/07/16  Emeline General, PA-C  traMADol (ULTRAM) 50 MG tablet Take 1 tablet (50 mg total) by mouth every 6 (six) hours as needed. Patient not taking: Reported on 09/02/2016 07/09/16   Kem Parkinson, PA-C    Family History Family History  Problem Relation Age of Onset  . Heart attack Mother   . Colon polyps Mother   . Clotting disorder Mother   . Diabetes Mother   . Heart disease Mother   . Cancer Father     colon  . Colon cancer Father   . Clotting disorder Father   . Diabetes Father   . Heart disease Father   . Clotting disorder Brother   . Heart disease Brother   . Diabetes Paternal Grandmother   . Heart attack Paternal Grandmother   . Hypertension Paternal Grandmother     Social History Social History  Substance Use Topics  . Smoking status: Former Smoker    Quit date: 06/11/2005  . Smokeless tobacco: Never Used  . Alcohol use No     Allergies   Hydrocodone   Review of Systems Review of Systems  Constitutional: Negative for chills and fever.  Respiratory: Negative for cough and shortness of breath.   Cardiovascular: Negative for leg swelling.  Gastrointestinal:       Negative for bowel incontinence.  Genitourinary:       Negative for urinary incontinence.  Musculoskeletal: Positive for back pain and myalgias.  Neurological: Positive for numbness (tingling).   Physical Exam Updated Vital Signs BP (!) 161/94   Pulse 91   Temp 97.7 F (36.5 C) (Oral)   Resp 18   SpO2 100%   Physical Exam  Constitutional: She is oriented to person, place, and time. She appears well-developed and well-nourished. No distress.  Patient is afebrile, non-toxic appearing, sitting comfortably in chair in no acute distress.  HENT:  Head: Normocephalic and atraumatic.  Eyes: Conjunctivae and EOM are normal.   Neck: Neck supple. No tracheal deviation present.  Cardiovascular: Normal rate, regular rhythm and normal heart sounds.   Pulmonary/Chest: Effort normal and breath sounds normal. No respiratory distress. She has no wheezes. She has no rales.  Lungs are clear and equal bilaterally.  Musculoskeletal: Normal range of motion. She exhibits tenderness.  Midline tenderness with palpation of the lumbar spine. TTP of the right lower lumbar musculature. No left lower back tenderness. 5/5 strength in lower extremities bilaterally.  Neurological: She is alert and oriented to person, place, and time.  Decreased sensation of the right lower extremity below the knee. She has strong pulses bilaterally. Both extremities are warm. Normal stance and gait.  Skin: Skin is warm and dry. No erythema. No pallor.  Psychiatric: She has a normal mood and affect. Her behavior is normal.  Nursing note and vitals reviewed.  ED Treatments / Results  Labs (all labs ordered are listed, but only abnormal results are displayed) Labs Reviewed  PREGNANCY, URINE  D-DIMER, QUANTITATIVE (NOT AT [facility])    EKG  EKG Interpretation None       Radiology Dg Lumbar Spine Complete  Result Date: 09/02/2016 CLINICAL DATA:  Acute onset right low back pain with sciatica. No known injury. EXAM: LUMBAR SPINE - COMPLETE 4+ VIEW COMPARISON:  05/17/2016 FINDINGS: There is no evidence of lumbar spine fracture. Alignment is normal. Mild degenerative disc disease at L4-5 remains stable. Other disc spaces are maintained. No other osseous abnormality identified. IMPRESSION: No acute findings. Mild L4-5 degenerative disc disease. Electronically Signed   By: Earle Gell M.D.   On: 09/02/2016 12:23    Procedures Procedures (including critical care time)  DIAGNOSTIC STUDIES: Oxygen Saturation is 99% on RA, normal by my interpretation.    COORDINATION OF CARE: 10:49 AM Discussed treatment plan with pt at bedside and pt agreed to  plan.  Medications Ordered in ED Medications  ibuprofen (ADVIL,MOTRIN) tablet 800 mg (800 mg Oral Given 09/02/16 1113)  oxyCODONE-acetaminophen (PERCOCET/ROXICET) 5-325 MG per tablet 1 tablet (1 tablet Oral Given 09/02/16 1258)  ondansetron (ZOFRAN-ODT) disintegrating tablet 4 mg (4 mg Oral Given 09/02/16 1348)     Initial Impression / Assessment and Plan / ED Course  I have reviewed the triage vital signs and the nursing notes.  Pertinent labs & imaging results that were available during my care of the patient were reviewed by me and considered in my medical decision making (see chart for details).    Patient presents with back pain and right leg pain. Lumbar films with degenerative lumbar discs L4-L5 no fractures or dislocation.  Patient appeared in mild discomfort while in ED. Husband requested more pain medicaions after ibuprofen, she was given percocet and reported nausea with improvement from zofran, no improvement of pain from percocet per husband. On my reassessment, she appeared comfortable with no acute distress. She was agreeable to discharge plan and did not seek pain medications.   LE DVT study negative for DVT. Exam otherwise reassuring  No gross neurological deficits and normal neuro exam with the exception of reported mild reduced sensation of the right lower extremity.  Patient has no gait abnormality or concern for cauda equina.  No loss of bowel or bladder control, fever, night sweats, weight loss, h/o malignancy, or IVDU.  RICE protocol and pain medications indicated and discussed with patient.   Will discharge home with steroid burst and follow up with PCP. She was well-appearing and agreed with plan. Discussed strict return precautions and advised to return to the emergency department if experiencing any new or worsening symptoms. Instructions were understood and patient agreed with discharge plan.  Final Clinical Impressions(s) / ED Diagnoses  Discussed strict return  precautions and advised to return to the emergency department if experiencing any new or worsening symptoms. Instructions were understood and patient agreed with discharge plan. Final diagnoses:  Chronic right-sided low back pain with right-sided sciatica    New Prescriptions New Prescriptions   PREDNISONE (DELTASONE) 10 MG TABLET    Take 4 tablets (40 mg total) by mouth daily.   I personally performed the services described in this documentation, which was scribed in my presence. The recorded information has been reviewed and  is accurate.    Emeline General, PA-C 09/02/16 [place], MD 09/02/16 [phone]

## 2016-10-17 ENCOUNTER — Emergency Department (HOSPITAL_COMMUNITY)
Admission: EM | Admit: 2016-10-17 | Discharge: 2016-10-17 | Disposition: A | Discharge status: Home / Self Care | Payer: Self-pay | Attending: Emergency Medicine | Admitting: Emergency Medicine

## 2016-10-17 ENCOUNTER — Encounter (HOSPITAL_COMMUNITY): Payer: Self-pay | Admitting: *Deleted

## 2016-10-17 DIAGNOSIS — Y998 Other external cause status: Secondary | ICD-10-CM | POA: Insufficient documentation

## 2016-10-17 DIAGNOSIS — R531 Weakness: Secondary | ICD-10-CM

## 2016-10-17 DIAGNOSIS — R52 Pain, unspecified: Secondary | ICD-10-CM

## 2016-10-17 DIAGNOSIS — I10 Essential (primary) hypertension: Secondary | ICD-10-CM | POA: Insufficient documentation

## 2016-10-17 DIAGNOSIS — M791 Myalgia: Secondary | ICD-10-CM | POA: Insufficient documentation

## 2016-10-17 DIAGNOSIS — F17210 Nicotine dependence, cigarettes, uncomplicated: Secondary | ICD-10-CM | POA: Insufficient documentation

## 2016-10-17 DIAGNOSIS — R0602 Shortness of breath: Secondary | ICD-10-CM | POA: Insufficient documentation

## 2016-10-17 DIAGNOSIS — S00461A Insect bite (nonvenomous) of right ear, initial encounter: Secondary | ICD-10-CM | POA: Insufficient documentation

## 2016-10-17 DIAGNOSIS — Y939 Activity, unspecified: Secondary | ICD-10-CM | POA: Insufficient documentation

## 2016-10-17 DIAGNOSIS — W57XXXA Bitten or stung by nonvenomous insect and other nonvenomous arthropods, initial encounter: Secondary | ICD-10-CM | POA: Insufficient documentation

## 2016-10-17 DIAGNOSIS — Y929 Unspecified place or not applicable: Secondary | ICD-10-CM | POA: Insufficient documentation

## 2016-10-17 DIAGNOSIS — R5383 Other fatigue: Secondary | ICD-10-CM | POA: Insufficient documentation

## 2016-10-17 DIAGNOSIS — R197 Diarrhea, unspecified: Secondary | ICD-10-CM | POA: Insufficient documentation

## 2016-10-17 DIAGNOSIS — R509 Fever, unspecified: Secondary | ICD-10-CM | POA: Insufficient documentation

## 2016-10-17 LAB — BASIC METABOLIC PANEL
Anion gap: 9 (ref 5–15)
BUN: 11 mg/dL (ref 6–20)
CO2: 23 mmol/L (ref 22–32)
Calcium: 8.8 mg/dL — ABNORMAL LOW (ref 8.9–10.3)
Chloride: 105 mmol/L (ref 101–111)
Creatinine, Ser: 0.51 mg/dL (ref 0.44–1.00)
GFR calc Af Amer: >60 mL/min (ref >60)
GFR calc non Af Amer: >60 mL/min (ref >60)
Glucose, Bld: 130 mg/dL — ABNORMAL HIGH (ref 65–99)
Potassium: 3.6 mmol/L (ref 3.5–5.1)
Sodium: 137 mmol/L (ref 135–145)

## 2016-10-17 LAB — TSH: TSH: 1.154 u[IU]/mL (ref 0.350–4.500)

## 2016-10-17 LAB — CBC
HCT: 33.7 % — ABNORMAL LOW (ref 36.0–46.0)
Hemoglobin: 11 g/dL — ABNORMAL LOW (ref 12.0–15.0)
MCH: 27.2 pg (ref 26.0–34.0)
MCHC: 32.6 g/dL (ref 30.0–36.0)
MCV: 83.2 fL (ref 78.0–100.0)
Platelets: 286 10*3/uL (ref 150–400)
RBC: 4.05 MIL/uL (ref 3.87–5.11)
RDW: 14.6 % (ref 11.5–15.5)
WBC: 8.9 10*3/uL (ref 4.0–10.5)

## 2016-10-17 LAB — URINALYSIS, ROUTINE W REFLEX MICROSCOPIC
Bilirubin Urine: NEGATIVE
Glucose, UA: NEGATIVE mg/dL
Ketones, ur: 5 mg/dL — AB
Leukocytes, UA: NEGATIVE
Nitrite: NEGATIVE
Protein, ur: NEGATIVE mg/dL
Specific Gravity, Urine: 1.014 (ref 1.005–1.030)
pH: 6 (ref 5.0–8.0)

## 2016-10-17 LAB — TROPONIN I: Troponin I: <0.03 ng/mL (ref <0.03)

## 2016-10-17 MED ORDER — OXYCODONE-ACETAMINOPHEN 5-325 MG PO TABS
1.0000 | ORAL_TABLET | Freq: Once | ORAL | Status: AC
  Administered 2016-10-17: 1 via ORAL
  Filled 2016-10-17: qty 1

## 2016-10-17 MED ORDER — DOXYCYCLINE HYCLATE 100 MG PO CAPS: 100.0000 mg | ORAL_CAPSULE | Freq: Two times a day (BID) | ORAL | 0 refills | Status: AC

## 2016-10-17 NOTE — ED Notes (Addendum)
Started over 1 week ago. Refused wheelchair.

## 2016-10-17 NOTE — ED Notes (Signed)
Pt states she has taken percocet without reaction.

## 2016-10-17 NOTE — ED Triage Notes (Addendum)
Pt comes in with fatigue for 1 week with dizziness starting 2-3 days ago. Pt has pain all over. Pt has had nausea, denies vomiting. She has had diarrhea for 2-3 days as well. Denies any unilateral weakness.

## 2016-10-17 NOTE — ED Provider Notes (Signed)
[facility] DEPT Provider Note   CSN: [id number] Arrival date & time: 10/17/16  1339  By signing my name below, I, Alexis Miranda, attest that this documentation has been prepared under the direction and in the presence of Alexis Manifold, MD . Electronically Signed: Levester Miranda, Scribe. 10/17/2016. 5:39 PM.  History   Chief Complaint Chief Complaint  Patient presents with  . Weakness   HPI Comments Alexis Miranda is a 46 y.o. female with a PMHx significant for DVT, HTN, IBS and seizures, who presents to the Emergency Department with complaints of intermittent generalized fatigue and myalgias x1 wk.  Pt additionally endorses nausea, diarrhea, headaches, blurry vision, subjective fever, cough and dyspnea.  Low PO intake.  No vomiting, rashes or joint swelling.  Pt reports a recent tick bite underneath her right ear.  She states that the tick was not engorged, however, she is unable to state how long it was attached.  She denies experiencing any other acute sx, including abnormal bleeding, dark stools or dysuria.  The history is provided by the patient. No language interpreter was used.    Past Medical History:  Diagnosis Date  . Anemia   . Chronic headaches   . Colon polyps   . DVT (deep venous thrombosis) ([place])   . Gallstones   . GERD (gastroesophageal reflux disease)   . Hypertension    no meds  . IBS (irritable bowel syndrome)   . Pneumonia   . Seizures ([place])   . Uterine cyst     Patient Active Problem List   Diagnosis Date Noted  . Chronic cholecystitis with calculus 06/11/2012    Past Surgical History:  Procedure Laterality Date  . CESAREAN SECTION  06/07/02 03/07/06   2x  . CHOLECYSTECTOMY  06/12/2012   Procedure: LAPAROSCOPIC CHOLECYSTECTOMY WITH INTRAOPERATIVE CHOLANGIOGRAM;  Surgeon: Imogene Burn. Georgette Dover, MD;  Location: [facility];  Service: General;  Laterality: N/A;  . DILATION AND CURETTAGE OF UTERUS      OB History    Gravida Para Term  Preterm AB Living   4 2 2   2      SAB TAB Ectopic Multiple Live Births   2               Home Medications    Prior to Admission medications   Medication Sig Start Date End Date Taking? Authorizing Provider  acetaminophen (TYLENOL) 500 MG tablet Take 1,500 mg by mouth every 6 (six) hours as needed for mild pain.    [provider]  etodolac (LODINE) 300 MG capsule Take 1 capsule (300 mg total) by mouth every 8 (eight) hours. Patient not taking: Reported on 06/21/2016 05/23/16   Alexis Rank, MD  famotidine (PEPCID) 20 MG tablet Take 1 tablet (20 mg total) by mouth 2 (two) times daily. Patient taking differently: Take 20 mg by mouth daily.  05/07/16   Alexis Pence, MD  gabapentin (NEURONTIN) 300 MG capsule Take 1 capsule (300 mg total) by mouth at bedtime. Patient not taking: Reported on 09/02/2016 05/07/16   Alexis Pence, MD  omeprazole (PRILOSEC) 40 MG capsule Take 1 capsule (40 mg total) by mouth daily. Patient not taking: Reported on 09/02/2016 12/01/15   Alexis Erp, PA  ondansetron (ZOFRAN ODT) 4 MG disintegrating tablet Take 1 tablet (4 mg total) by mouth every 8 (eight) hours as needed for nausea or vomiting. Patient not taking: Reported on 06/21/2016 05/07/16   Alexis Pence, MD  oxyCODONE-acetaminophen (PERCOCET/ROXICET) 5-325 MG tablet  Take 1 tablet by mouth every 4 (four) hours as needed for severe pain. Patient not taking: Reported on 06/21/2016 05/07/16   Alexis Pence, MD  traMADol (ULTRAM) 50 MG tablet Take 1 tablet (50 mg total) by mouth every 6 (six) hours as needed. Patient not taking: Reported on 09/02/2016 07/09/16   Kem Parkinson, PA-C    Family History Family History  Problem Relation Age of Onset  . Heart attack Mother   . Colon polyps Mother   . Clotting disorder Mother   . Diabetes Mother   . Heart disease Mother   . Cancer Father        colon  . Colon cancer Father   . Clotting disorder Father   . Diabetes Father   . Heart disease Father    . Clotting disorder Brother   . Heart disease Brother   . Diabetes Paternal Grandmother   . Heart attack Paternal Grandmother   . Hypertension Paternal Grandmother     Social History Social History  Substance Use Topics  . Smoking status: Current Every Day Smoker    Packs/day: 0.50    Types: Cigarettes    Last attempt to quit: 06/11/2005  . Smokeless tobacco: Never Used  . Alcohol use No     Allergies   Hydrocodone  Review of Systems Review of Systems  Constitutional: Positive for fatigue and fever.  Eyes: Positive for visual disturbance.  Respiratory: Positive for cough and shortness of breath.   Gastrointestinal: Positive for diarrhea and nausea. Negative for blood in stool and vomiting.  Genitourinary: Negative for difficulty urinating and dysuria.  Musculoskeletal: Positive for myalgias. Negative for arthralgias and joint swelling.  Skin: Negative for rash.  Neurological: Positive for dizziness and headaches.  All other systems reviewed and are negative.  Physical Exam Updated Vital Signs BP (!) 148/89   Pulse 88   Temp 98.5 F (36.9 C) (Oral)   Resp 18   Ht 5\' 1"  (1.549 m)   Wt 200 lb (90.7 kg)   LMP 10/10/2016   SpO2 99%   BMI 37.79 kg/m   Physical Exam  Constitutional: She is oriented to person, place, and time. She appears well-developed and well-nourished. No distress.  HENT:  Head: Normocephalic and atraumatic.  Eyes: EOM are normal.  Neck: Normal range of motion.  Cardiovascular: Normal rate, regular rhythm and normal heart sounds.   Pulmonary/Chest: Effort normal and breath sounds normal.  Abdominal: Soft. She exhibits no distension. There is no tenderness.  Musculoskeletal: Normal range of motion.  Neurological: She is alert and oriented to person, place, and time.  Skin: Skin is warm and dry.  Psychiatric: She has a normal mood and affect. Judgment normal.  Nursing note and vitals reviewed.  ED Treatments / Results  DIAGNOSTIC  STUDIES: Oxygen Saturation is 99% on room air, normal by my interpretation.    COORDINATION OF CARE: 5:22 PM Discussed treatment plan with pt at bedside and pt agreed to plan.  Labs (all labs ordered are listed, but only abnormal results are displayed) Labs Reviewed  BASIC METABOLIC PANEL - Abnormal; Notable for the following:       Result Value   Glucose, Bld 130 (*)    Calcium 8.8 (*)    All other components within normal limits  CBC - Abnormal; Notable for the following:    Hemoglobin 11.0 (*)    HCT 33.7 (*)    All other components within normal limits  URINALYSIS, ROUTINE W REFLEX MICROSCOPIC - Abnormal;  Notable for the following:    APPearance HAZY (*)    Hgb urine dipstick SMALL (*)    Ketones, ur 5 (*)    Bacteria, UA RARE (*)    Squamous Epithelial / LPF 0-5 (*)    All other components within normal limits  TROPONIN I  B. BURGDORFI ANTIBODIES  TSH    EKG  EKG Interpretation  Date/Time:  Wednesday October 17 2016 17:52:27 EDT Ventricular Rate:  87 PR Interval:  150 QRS Duration: 88 QT Interval:  393 QTC Calculation: 473 R Axis:   42 Text Interpretation:  Sinus rhythm Probable anteroseptal infarct, old Confirmed by Wilson Singer  MD, Zyriah Mask [phone]) on 10/17/2016 6:16:01 PM       Radiology No results found.  Procedures Procedures (including critical care time)  Medications Ordered in ED Medications - No data to display   Initial Impression / Assessment and Plan / ED Course  I have reviewed the triage vital signs and the nursing notes.  Pertinent labs & imaging results that were available during my care of the patient were reviewed by me and considered in my medical decision making (see chart for details).     46yF with generalized weakness, subjective fever, nausea and diffuse body aches for about a week. Her exam is nonfocal. Reports tick behind R ear over a week ago. She is not sure how long may have been attached or engorged or not. No concerning rash.  Afebrile. Early lyme can cause flu-like symptoms that she describes. I don't think a course of doxycycline is unreasonable.  Basic labs pretty unremarkable. Can check a TSH but hypothyroid generally isn't a painful condition. UA pending.   Final Clinical Impressions(s) / ED Diagnoses   Final diagnoses:  Generalized weakness  Whole body pain   New Prescriptions New Prescriptions   No medications on file     Alexis Manifold, MD 10/29/16 [phone]

## 2016-10-18 LAB — B. BURGDORFI ANTIBODIES: B burgdorferi Ab IgG+IgM: <0.91 {ISR} (ref 0.00–0.90)

## 2018-05-04 IMAGING — CT CT RENAL STONE PROTOCOL
2 of 4 series · 16 of 46 positions shown, 18 images · non-contrast
Comparison: None.

CLINICAL DATA: Right flank pain and nausea and vomiting and fever.

EXAM:
CT ABDOMEN AND PELVIS WITHOUT CONTRAST
TECHNIQUE: Multidetector CT imaging of the abdomen and pelvis was performed
following the standard protocol without IV contrast.

[Series 2: renal stone 5.0 · axial · 0.71mm/px · z∈[+779,+1164]mm · 13 of 85 slices shown, 15 images]
[im 4/85  soft-tissue]
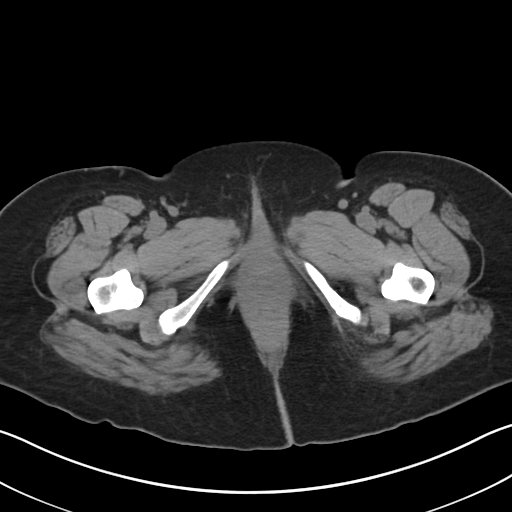
[im 4/85  bone]
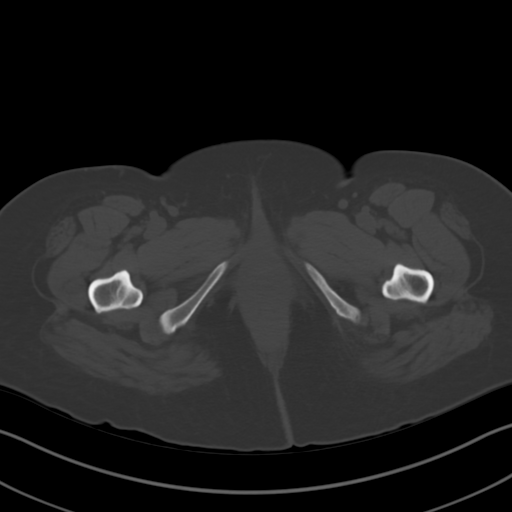
[im 10/85  soft-tissue]
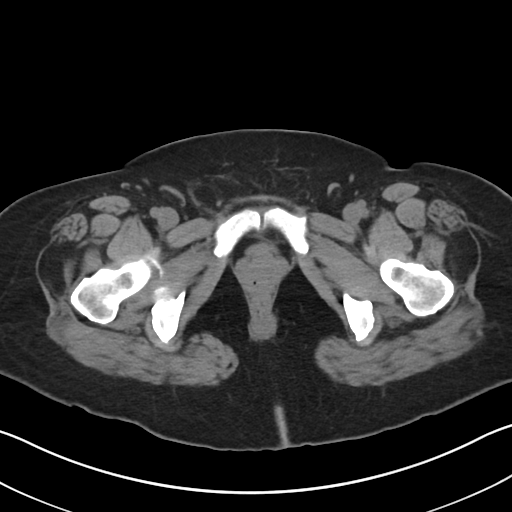
[im 17/85  soft-tissue]
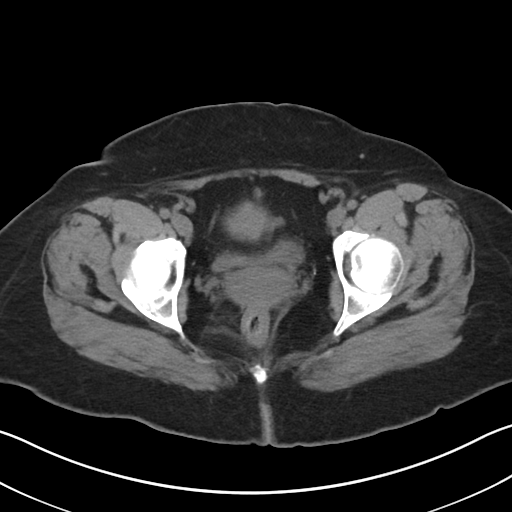
[im 23/85  soft-tissue]
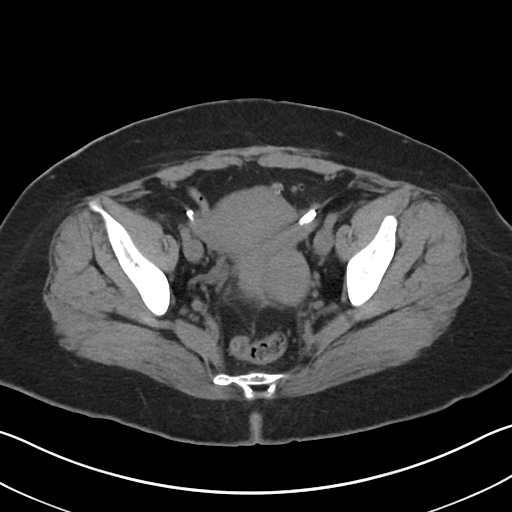
[im 30/85  soft-tissue]
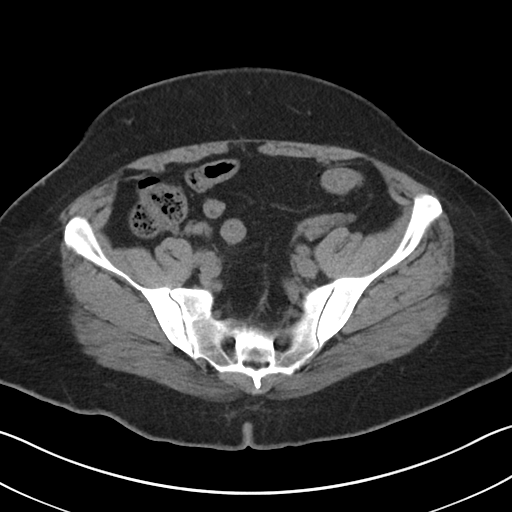
[im 36/85  soft-tissue]
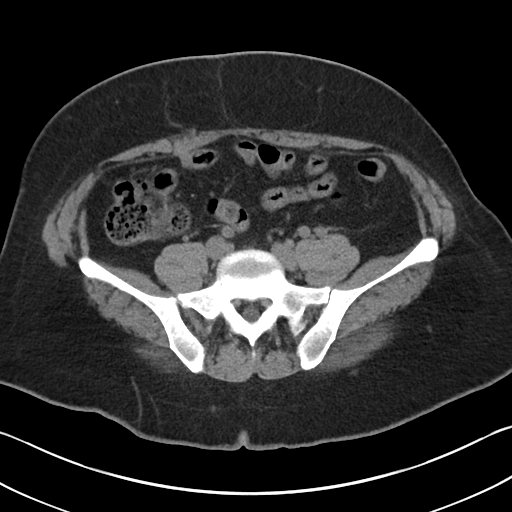
[im 43/85  soft-tissue]
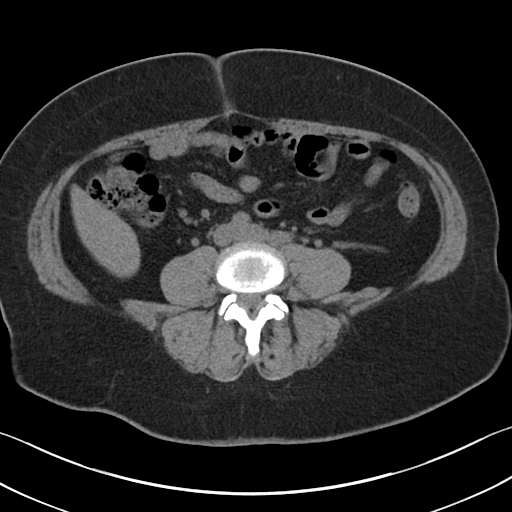
[im 49/85  soft-tissue]
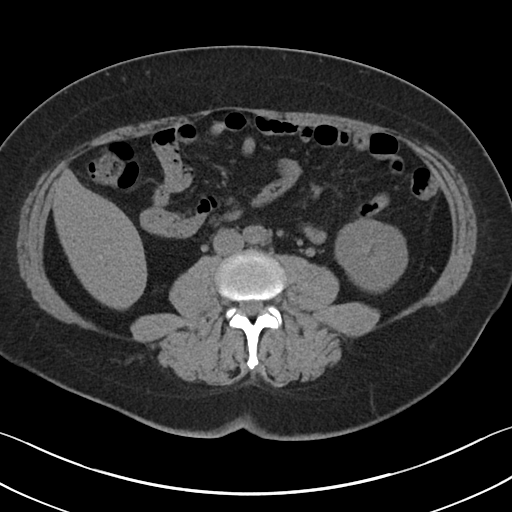
[im 55/85  soft-tissue]
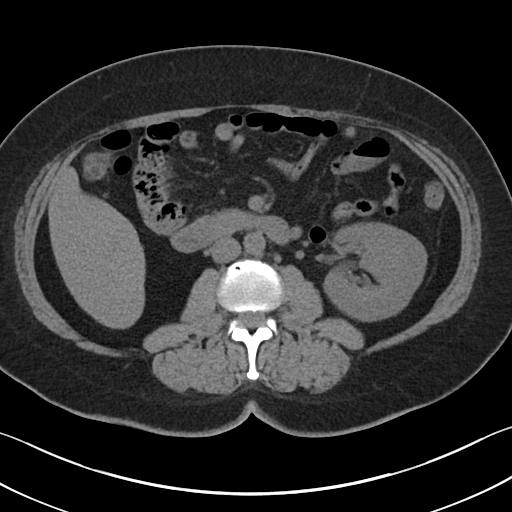
[im 55/85  bone]
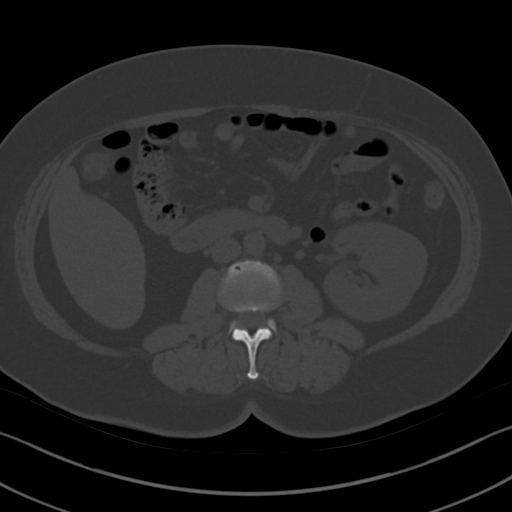
[im 62/85  soft-tissue]
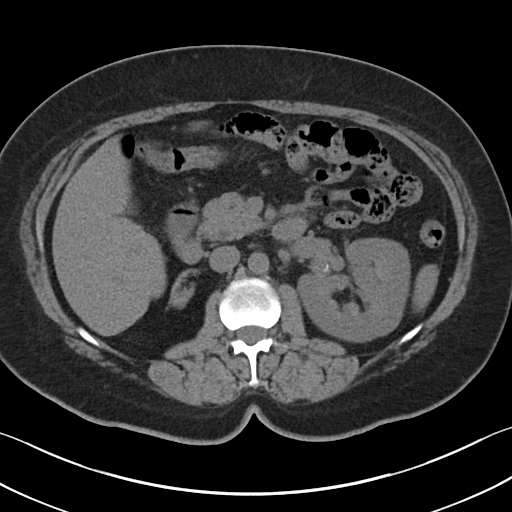
[im 68/85  soft-tissue]
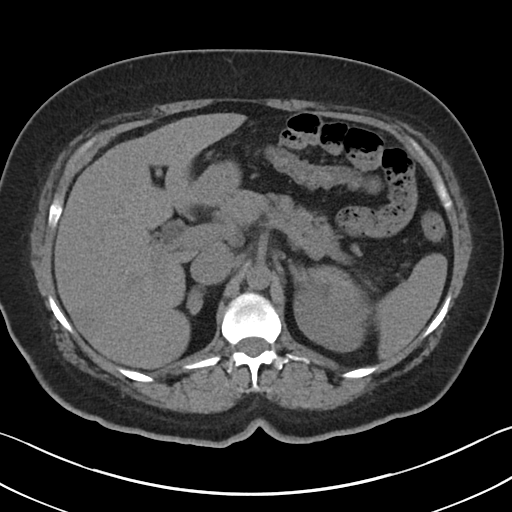
[im 75/85  soft-tissue]
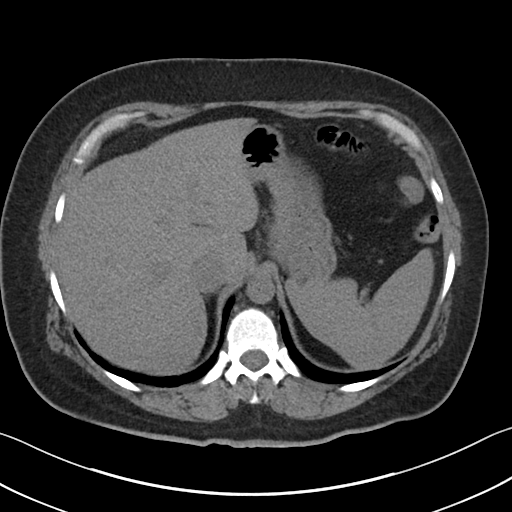
[im 81/85  soft-tissue]
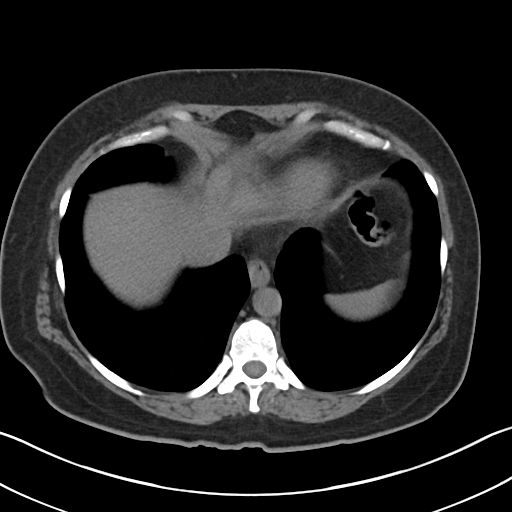

[Series 4: renal stone 3.0 cor · coronal · 0.72mm/px · 3 of 101 slices shown]
[im 34/101  soft-tissue]
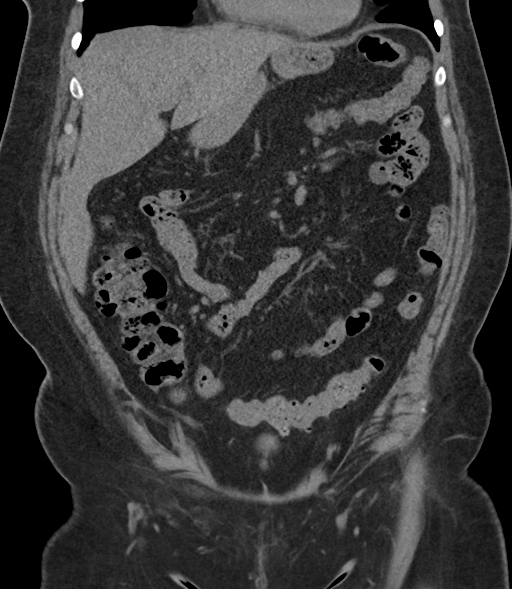
[im 45/101  soft-tissue]
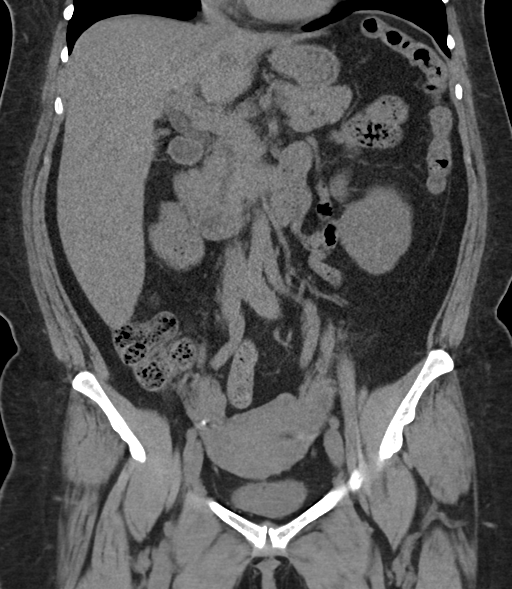
[im 56/101  soft-tissue]
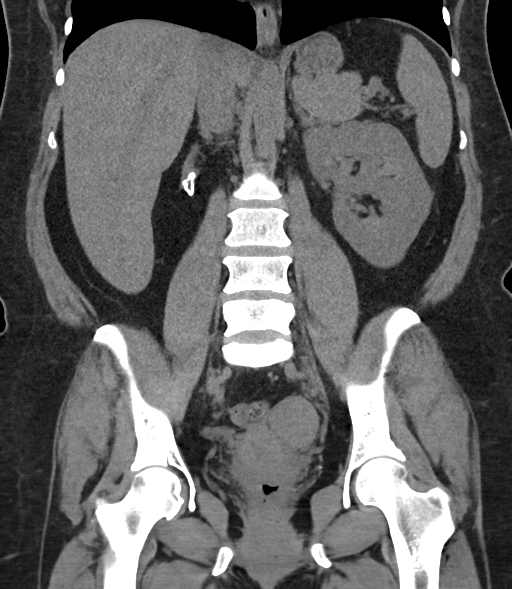

[16 of 46 positions shown; findings below may reference images not displayed]

FINDINGS: Lower chest: Normal.

Hepatobiliary: No focal liver abnormality is seen. Status post
cholecystectomy. No biliary dilatation.

Pancreas: Unremarkable. No pancreatic ductal dilatation or
surrounding inflammatory changes.

Spleen: Normal in size without focal abnormality.

Adrenals/Urinary Tract: Adrenal glands are normal. Marked atrophy
right kidney with dystrophic calcification. Compensatory hypertrophy
of the left kidney. No hydronephrosis. Bladder is normal.

Stomach/Bowel: Stomach is within normal limits. Appendix appears
normal. No evidence of bowel wall thickening, distention, or
inflammatory changes. There are few scattered diverticula in the
descending colon.

Vascular/Lymphatic: No significant vascular findings are present. No
enlarged abdominal or pelvic lymph nodes.

Reproductive: 4 cm fibroid at the left superolateral aspect of the
uterus. 13 mm cyst on the otherwise normal left ovary. Normal right
ovary. Tubal ligation clips in place.

Other: No abdominal wall hernia or abnormality. No abdominopelvic
ascites.

Musculoskeletal: No acute or significant osseous findings.
IMPRESSION: Benign-appearing abdomen. Longstanding severe atrophy of the right
kidney with compensatory hypertrophy of the left kidney.

## 2018-06-18 IMAGING — CT CT HEAD W/O CM
3 series · 14 of 47 positions shown, 16 images · non-contrast
Comparison: No priors.

CLINICAL DATA: 45-year-old female with history of right-sided
parieto-occipital headache, dizziness and nausea. Injury to the head
on a door frame because of dizziness. History of migraines.

EXAM:
CT HEAD WITHOUT CONTRAST
TECHNIQUE: Contiguous axial images were obtained from the base of the skull
through the vertex without intravenous contrast.

[Series 3: head 5.0 h30s · axial · 0.43mm/px · z∈[-81,+49]mm · 8 of 32 slices shown, 10 images]
[im 3/32  brain]
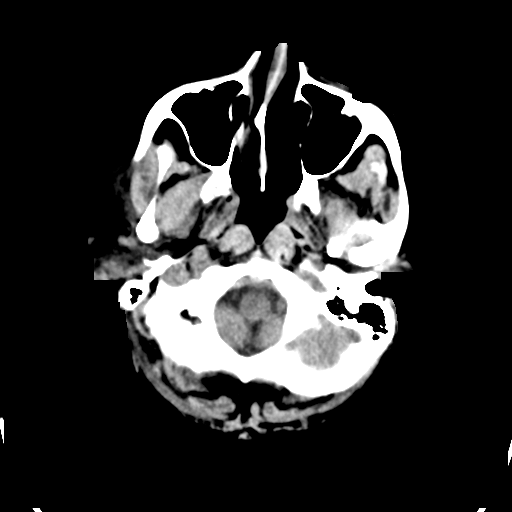
[im 3/32  bone]
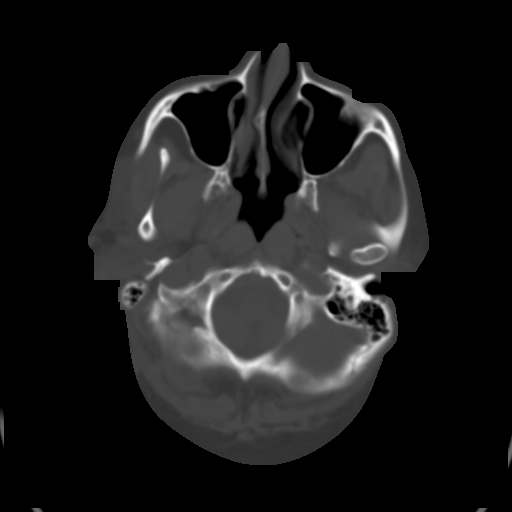
[im 7/32  brain]
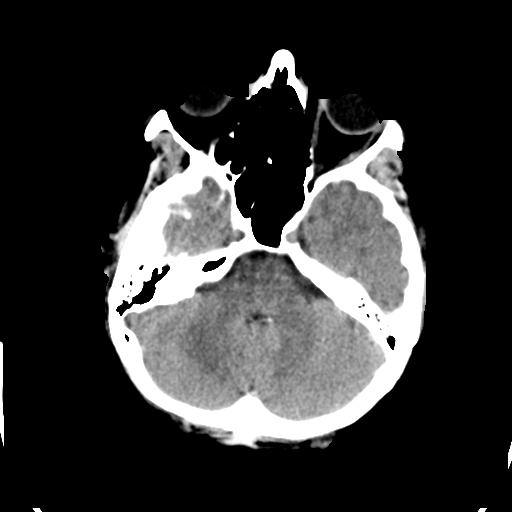
[im 10/32  brain]
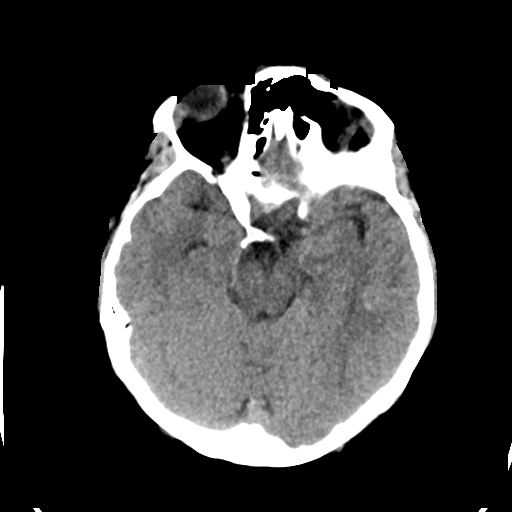
[im 14/32  brain]
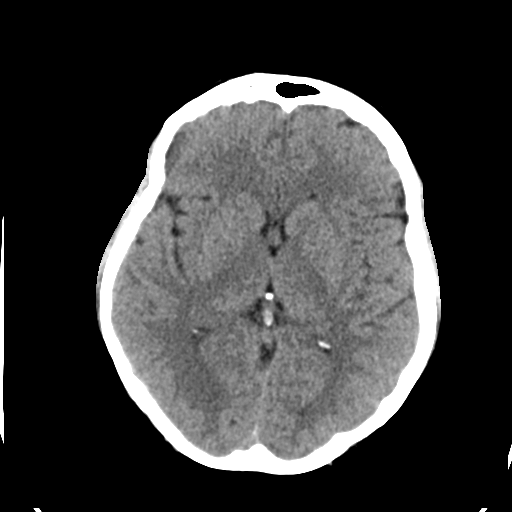
[im 18/32  brain]
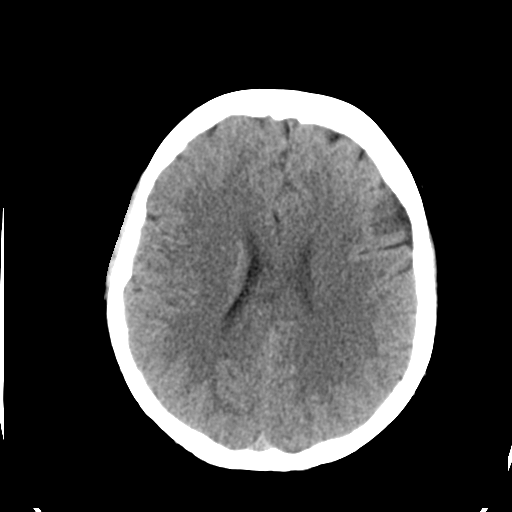
[im 18/32  bone]
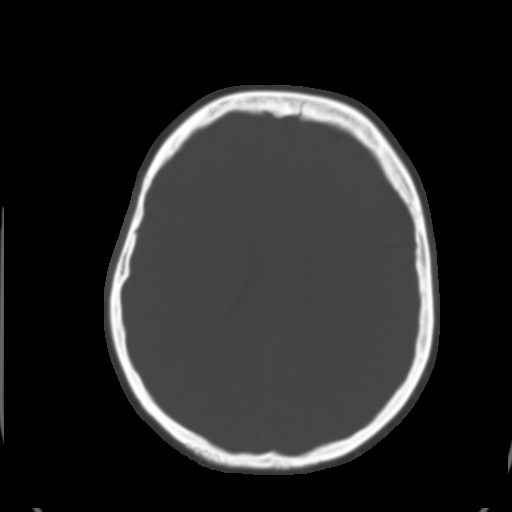
[im 22/32  brain]
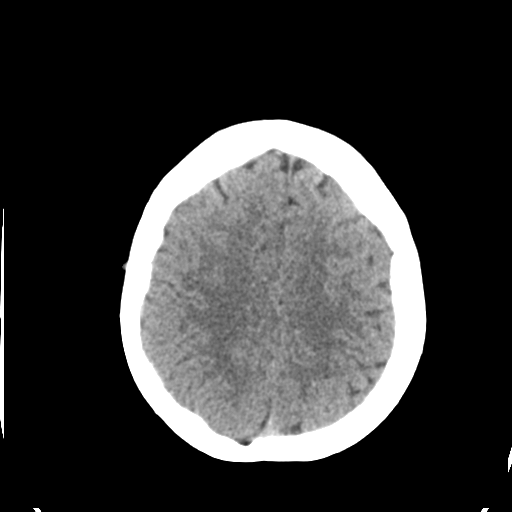
[im 25/32  brain]
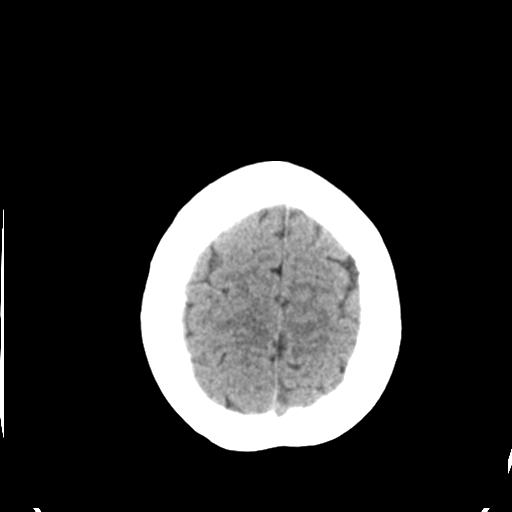
[im 29/32  brain]
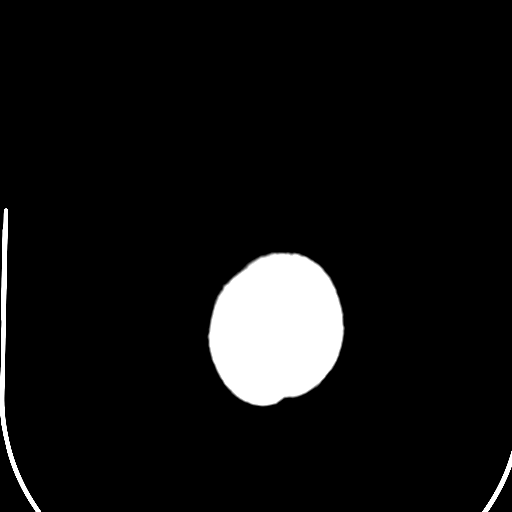

[Series 5: head 3.0 mpr cor · coronal · 0.28mm/px · 3 of 67 slices shown]
[im 23/67  brain]
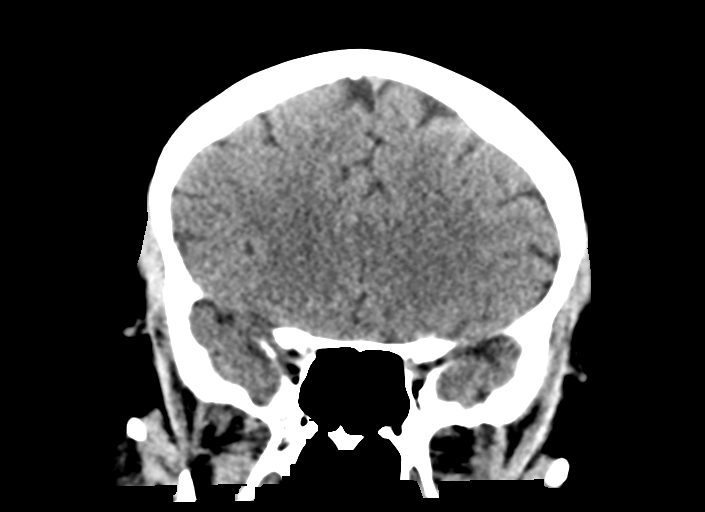
[im 30/67  brain]
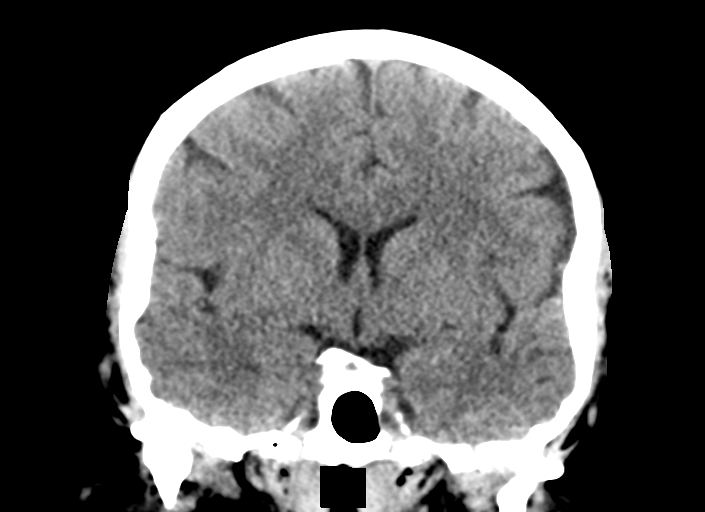
[im 37/67  brain]
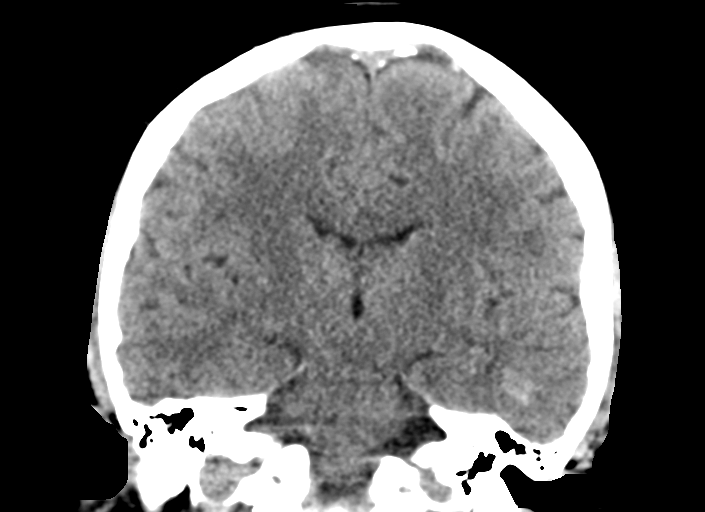

[Series 6: head 3.0 mpr sag · sagittal · 0.33mm/px · 3 of 67 slices shown]
[im 23/67  brain]
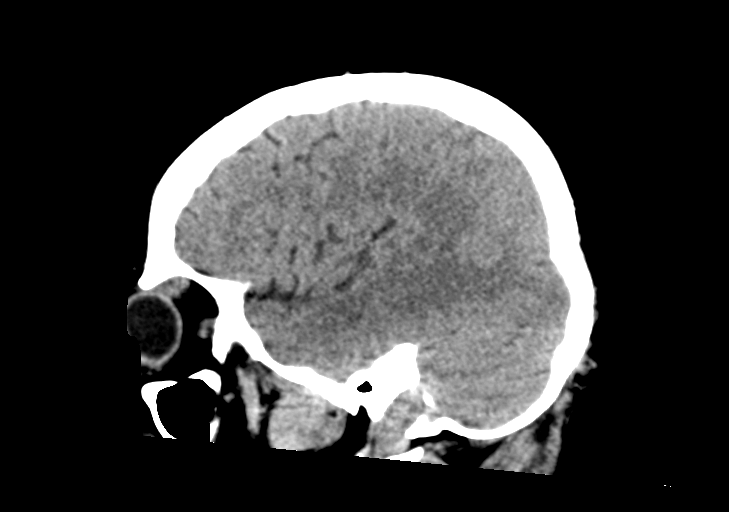
[im 34/67  brain]
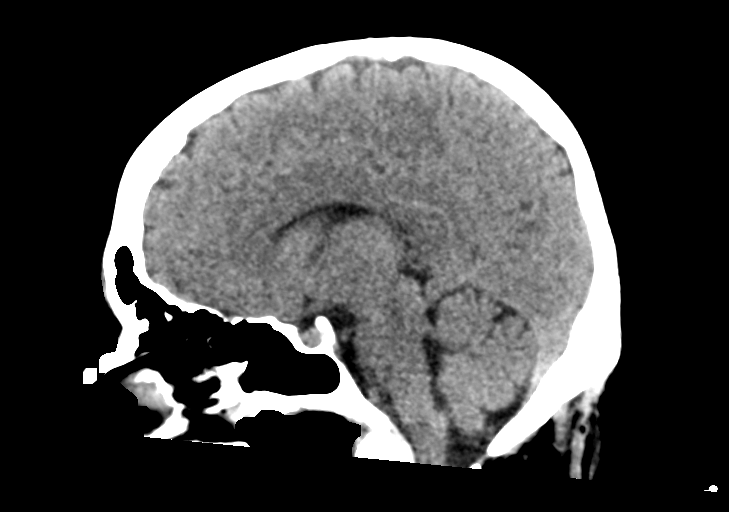
[im 45/67  brain]
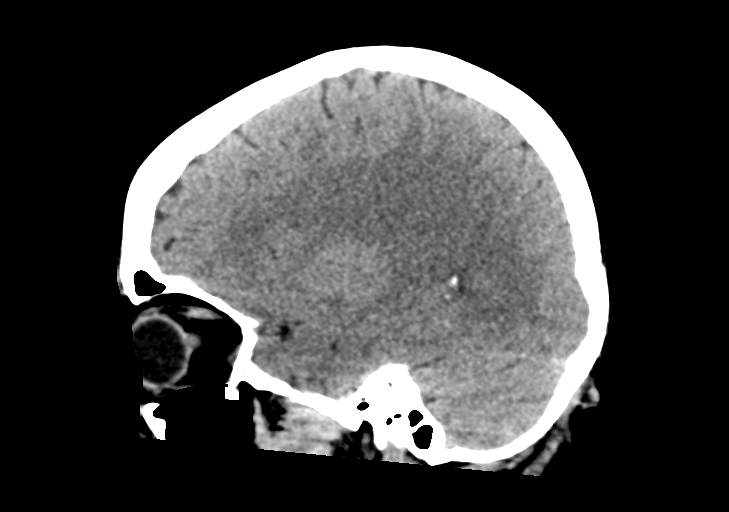

[14 of 47 positions shown; findings below may reference images not displayed]

FINDINGS: Brain: In the left temporal lobe (axial image 9 of series 3 and
coronal image 39 of series 5) there is a hyperdense lesion measuring
1.2 cm in diameter which is clearly separate from the dura,
concerning for either a primary CNS lesion (such as a cavernoma) or
less likely a metastatic lesion. This does not appear to represent a
focal hemorrhage. No evidence of acute infarction, hydrocephalus,
extra-axial collection or significant mass effect.

Vascular: No hyperdense vessel or unexpected calcification.

Skull: Normal. Negative for fracture or focal lesion.

Sinuses/Orbits: No acute finding.

Other: None.
IMPRESSION: 1. 1.2 cm hyperdense lesion in the left temporal lobe which is
indeterminate on CT imaging, but demonstrates no surrounding edema
are significant mass effect, likely to reflect a primary CNS lesion
such as a cavernoma. Further evaluation with MRI of the brain with
and without IV gadolinium is recommended at this time.
These results were called by telephone at the time of interpretation
on 06/21/2016 at [DATE] to Dr. JOEANNA JAMES, who verbally
acknowledged these results.

## 2020-11-21 ENCOUNTER — Encounter: Payer: Self-pay | Admitting: Gastroenterology

## 2021-01-17 ENCOUNTER — Encounter: Payer: Self-pay | Admitting: Gastroenterology
